# Patient Record
Sex: Female | Born: 1980
Health system: Southern US, Community
[De-identification: ages and names within clinical notes are randomized; demographics above are authoritative.]

## PROBLEM LIST (undated history)

## (undated) DIAGNOSIS — R519 Headache, unspecified: Secondary | ICD-10-CM

## (undated) DIAGNOSIS — Z8619 Personal history of other infectious and parasitic diseases: Secondary | ICD-10-CM

## (undated) HISTORY — DX: Personal history of other infectious and parasitic diseases: Z86.19

## (undated) HISTORY — DX: Headache, unspecified: R51.9

---

## 2018-09-25 ENCOUNTER — Ambulatory Visit (INDEPENDENT_AMBULATORY_CARE_PROVIDER_SITE_OTHER): Payer: 59 | Admitting: Physician Assistant

## 2018-09-25 ENCOUNTER — Other Ambulatory Visit: Payer: Self-pay

## 2018-09-25 ENCOUNTER — Encounter: Payer: Self-pay | Admitting: Physician Assistant

## 2018-09-25 VITALS — BP 130/82 | HR 71 | Temp 98.4°F | Ht 63.5 in | Wt 157.0 lb

## 2018-09-25 DIAGNOSIS — M25512 Pain in left shoulder: Secondary | ICD-10-CM | POA: Diagnosis not present

## 2018-09-25 NOTE — Patient Instructions (Signed)
It was great to meet you!  Please see Dr. Junius Roads on Monday for your shoulder.  Let's follow-up at your convenience for a physical, sooner if you have concerns.  Enjoy the beach!!  Inda Coke PA-C

## 2018-09-25 NOTE — Progress Notes (Signed)
Abigail Silva is a 38 y.o. female here to Establish Care.  I acted as a Education administrator for [employer], PA-C Anselmo Pickler, LPN  History of Present Illness:   Chief Complaint  Patient presents with  . Establish Care  . Shoulder Pain   Patient is new to establish care with me today. She and her family recently moved from [place]. Her husband is an Development worker, international aid for radiology with [employer]. 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 They have two children, aged 20 and 57.  Shoulder pain Pt c/o left shoulder pain x 4 months. She was doing push-ups about 3-4 months ago and had a moment where she had sudden sharp pain in her left shoulder. She didn't seek medical care for this. She used heating pad initially and modified her exercises. With time, her symptoms very gradually improved. A few weeks ago, her pain returned and is as severe as it was initially. She has difficulty lifting her kids, carrying groceries, sleeping, putting on clothes. Does get intermittent numbness/tingling in bilateral hands which has been chronic since her last pregnancy -- has never had this worked up. Denies prior neck injury or significant pain.  Health Maintenance: Immunizations -- UTD, declines Flu Colonoscopy -- N/A Mammogram -- N/A PAP -- overdue, pt will schedule with GYN Bone Density -- N/A Weight -- Weight: 157 lb (71.2 kg)   Depression screen [facility] 2/9 09/25/2018  Decreased Interest 0  Down, Depressed, Hopeless 0  PHQ - 2 Score 0    No flowsheet data found.   Other providers/specialists: Patient Care Team: Inda Coke, [place] as PCP - General (Physician Assistant)   Past Medical History:  Diagnosis Date  . Frequent headaches   . History of chicken pox      Social History   Socioeconomic History  . Marital status: Married    Spouse name: Not on file  . Number of children: Not on file  . Years of education: Not on file  . Highest  education level: Not on file  Occupational History  . Not on file  Social Needs  . Financial resource strain: Not on file  . Food insecurity    Worry: Not on file    Inability: Not on file  . Transportation needs    Medical: Not on file    Non-medical: Not on file  Tobacco Use  . Smoking status: Never Smoker  . Smokeless tobacco: Never Used  Substance and Sexual Activity  . Alcohol use: Yes    Alcohol/week: 2.0 standard drinks    Types: 2 Glasses of wine per week  . Drug use: Never  . Sexual activity: Yes    Birth control/protection: None  Lifestyle  . Physical activity    Days per week: Not on file    Minutes per session: Not on file  . Stress: Not on file  Relationships  . Social Herbalist on phone: Not on file    Gets together: Not on file    Attends religious service: Not on file    Active member of club or organization: Not on file    Attends meetings of clubs or organizations: Not on file    Relationship status: Not on file  . Intimate partner violence    Fear of current or ex partner: Not on file    Emotionally abused: Not on file    Physically abused: Not on file  Forced sexual activity: Not on file  Other Topics Concern  . Not on file  Social History Narrative  . Not on file    Past Surgical History:  Procedure Laterality Date  . CESAREAN SECTION  2011, 2015    History reviewed. No pertinent family history.  No Known Allergies   Current Medications:   Current Outpatient Medications:  .  ibuprofen (ADVIL) 200 MG tablet, Take 400 mg by mouth as needed., Disp: , Rfl:  .  Multiple Vitamin (MULTIVITAMIN) tablet, Take 1 tablet by mouth daily., Disp: , Rfl:    Review of Systems:   ROS Negative unless otherwise specified per HPI.  Vitals:   Vitals:   09/25/18 0825  BP: 130/82  Pulse: 71  Temp: 98.4 F (36.9 C)  TempSrc: Temporal  SpO2: 99%  Weight: 157 lb (71.2 kg)  Height: 5' 3.5" (1.613 m)      Body mass index is 27.38  kg/m.  Physical Exam:   Physical Exam Vitals signs and nursing note reviewed.  Constitutional:      General: She is not in acute distress.    Appearance: She is well-developed. She is not ill-appearing or toxic-appearing.  HENT:     Head: Normocephalic and atraumatic.  Cardiovascular:     Rate and Rhythm: Normal rate and regular rhythm.     Heart sounds: Normal heart sounds.  Pulmonary:     Effort: Pulmonary effort is normal. No accessory muscle usage or respiratory distress.     Breath sounds: Normal breath sounds.  Musculoskeletal:     Comments: LEFT Shoulder exam: No deformity of shoulders on inspection. No pain with palpation of shoulder landmarks.  Decreased ROM with abduction beyond 90 degrees, decreased ROM with flexion beyond 90 degrees.  Skin:    General: Skin is warm and dry.  Neurological:     Mental Status: She is alert.  Psychiatric:        Speech: Speech normal.        Behavior: Behavior is cooperative.     No results found for this or any previous visit.  Assessment and Plan:   [name] was seen today for establish care and shoulder pain.  Diagnoses and all orders for this visit:  Acute pain of left shoulder -     Ambulatory referral to Orthopedics   Will defer imaging, further work-up and treatment to Dr. Prince Rome, given her initial injury and resurgence of symptoms.  . Reviewed expectations re: course of current medical issues. . Discussed self-management of symptoms. . Outlined signs and symptoms indicating need for more acute intervention. . Patient verbalized understanding and all questions were answered. . See orders for this visit as documented in the electronic medical record. . Patient received an After-Visit Summary.  CMA or LPN served as scribe during this visit. History, Physical, and Plan performed by medical provider. The above documentation has been reviewed and is accurate and complete.  Jarold Motto, PA-C

## 2018-09-30 ENCOUNTER — Ambulatory Visit: Payer: 59 | Admitting: Family Medicine

## 2018-10-02 ENCOUNTER — Ambulatory Visit (INDEPENDENT_AMBULATORY_CARE_PROVIDER_SITE_OTHER): Payer: 59 | Admitting: Family Medicine

## 2018-10-02 ENCOUNTER — Encounter: Payer: Self-pay | Admitting: Family Medicine

## 2018-10-02 DIAGNOSIS — G8929 Other chronic pain: Secondary | ICD-10-CM

## 2018-10-02 DIAGNOSIS — M25512 Pain in left shoulder: Secondary | ICD-10-CM

## 2018-10-02 MED ORDER — IBUPROFEN 800 MG PO TABS: 800.0000 mg | ORAL_TABLET | Freq: Three times a day (TID) | ORAL | 1 refills | Status: DC | PRN

## 2018-10-02 MED FILL — IBUPROFEN 800 MG TABS: 800 | 30 days supply | Qty: 90 | Fill #0

## 2018-10-02 NOTE — Progress Notes (Signed)
Office Visit Note   Patient: Abigail Silva           Date of Birth: 08/21/1980           MRN: [id number] Visit Date: 10/02/2018 Requested by: Inda Coke, [address] [place],  [place] [address] PCP: Inda Coke, [place]  Subjective: Chief Complaint  Patient presents with  . Left Shoulder - Pain    Started having soreness in the shoulder in March. Tried to exercise through the pain, but stopped it. Then tried exercise again after it "seemed better." Started having shooting pains through the shoulder. Decreased ROM.    HPI: She is here with left shoulder pain.  Symptoms started in March.  She was being very active at that time, doing a lot of exercising including push-ups.  At some point she started noticing pain in the anterior lateral shoulder with certain movements.  She took it easy for a while and then seemed to be getting better, but as soon as she resumed exercises her pain came back.  She takes ibuprofen only when the pain is severe.  She is never had problems with her shoulder before.  She is otherwise been in good health.              ROS: No fevers or chills.  All other systems were reviewed and are negative.  Objective: Vital Signs: LMP 09/10/2018   Physical Exam:  General:  Alert and oriented, in no acute distress. Pulm:  Breathing unlabored. Psy:  Normal mood, congruent affect. Skin: No rash or erythema. Left shoulder: Very early adhesive capsulitis with abduction of 85 degrees compared to 110 degrees on the right.  Internal and external rotation range of motion still symmetric.  She is slightly tender near the long head biceps tendon, no subluxation detectable.  She is moderately tender in the lateral subacromial space and that seems to reproduce the pain.  Isometric rotator cuff strength is 5/5.  Speeds test is negative, O'Brien's test is negative.  Imaging: Diagnostic ultrasound left shoulder: Long head biceps tendon is located in the superior there is  slight swelling proximally and some fluid in the sheath without hyperemia on power Doppler imaging.  Subscapularis tendon has normal echotexture.  Supraspinatus likewise looks normal.  There is some fluid in the subacromial/subdeltoid bursa without hyperemia on power Doppler imaging.  Infraspinatus looks intact.  Assessment & Plan: 1.  Left shoulder impingement with subacromial/subdeltoid bursitis and early adhesive capsulitis. -We will try ibuprofen more regularly for the next little weeks.  I gave her some home exercises to do.  If symptoms are improving, then we could try formal physical therapy or else injection into the subacromial/subdeltoid bursa.  I will see her back as needed.     Procedures: No procedures performed  No notes on file     PMFS History: There are no active problems to display for this patient.  Past Medical History:  Diagnosis Date  . Frequent headaches   . History of chicken pox     Family History  Problem Relation Age of Onset  . Uterine cancer Mother   . Breast cancer Maternal Grandmother   . Colon cancer Neg Hx     Past Surgical History:  Procedure Laterality Date  . CESAREAN SECTION  2011, 2015   Social History   Occupational History  . Not on file  Tobacco Use  . Smoking status: Never Smoker  . Smokeless tobacco: Never Used  Substance and Sexual  Activity  . Alcohol use: Yes    Alcohol/week: 2.0 standard drinks    Types: 2 Glasses of wine per week  . Drug use: Never  . Sexual activity: Yes    Birth control/protection: None

## 2018-10-04 ENCOUNTER — Other Ambulatory Visit: Payer: Self-pay

## 2018-10-04 ENCOUNTER — Ambulatory Visit (INDEPENDENT_AMBULATORY_CARE_PROVIDER_SITE_OTHER): Payer: 59 | Admitting: Physician Assistant

## 2018-10-04 ENCOUNTER — Encounter: Payer: Self-pay | Admitting: Physician Assistant

## 2018-10-04 VITALS — BP 116/80 | HR 77 | Temp 98.3°F | Ht 63.0 in | Wt 157.0 lb

## 2018-10-04 DIAGNOSIS — R202 Paresthesia of skin: Secondary | ICD-10-CM

## 2018-10-04 DIAGNOSIS — Z0001 Encounter for general adult medical examination with abnormal findings: Secondary | ICD-10-CM

## 2018-10-04 DIAGNOSIS — Z0000 Encounter for general adult medical examination without abnormal findings: Secondary | ICD-10-CM | POA: Diagnosis not present

## 2018-10-04 DIAGNOSIS — Z136 Encounter for screening for cardiovascular disorders: Secondary | ICD-10-CM

## 2018-10-04 DIAGNOSIS — R200 Anesthesia of skin: Secondary | ICD-10-CM

## 2018-10-04 DIAGNOSIS — Z114 Encounter for screening for human immunodeficiency virus [HIV]: Secondary | ICD-10-CM

## 2018-10-04 DIAGNOSIS — Z13220 Encounter for screening for lipoid disorders: Secondary | ICD-10-CM | POA: Diagnosis not present

## 2018-10-04 LAB — LIPID PANEL
Cholesterol: 179 mg/dL (ref 0–200)
HDL: 46.2 mg/dL (ref >39.00)
LDL Cholesterol: 119 mg/dL — ABNORMAL HIGH (ref 0–99)
NonHDL: 133.05
Total CHOL/HDL Ratio: 4
Triglycerides: 70 mg/dL (ref 0.0–149.0)
VLDL: 14 mg/dL (ref 0.0–40.0)

## 2018-10-04 LAB — CBC WITH DIFFERENTIAL/PLATELET
Basophils Absolute: 0 10*3/uL (ref 0.0–0.1)
Basophils Relative: 0.5 % (ref 0.0–3.0)
Eosinophils Absolute: 0.2 10*3/uL (ref 0.0–0.7)
Eosinophils Relative: 2.2 % (ref 0.0–5.0)
HCT: 39.6 % (ref 36.0–46.0)
Hemoglobin: 12.8 g/dL (ref 12.0–15.0)
Lymphocytes Relative: 35.8 % (ref 12.0–46.0)
Lymphs Abs: 2.7 10*3/uL (ref 0.7–4.0)
MCHC: 32.4 g/dL (ref 30.0–36.0)
MCV: 87.3 fl (ref 78.0–100.0)
Monocytes Absolute: 0.5 10*3/uL (ref 0.1–1.0)
Monocytes Relative: 6.3 % (ref 3.0–12.0)
Neutro Abs: 4.1 10*3/uL (ref 1.4–7.7)
Neutrophils Relative %: 55.2 % (ref 43.0–77.0)
Platelets: 243 10*3/uL (ref 150.0–400.0)
RBC: 4.53 Mil/uL (ref 3.87–5.11)
RDW: 13.1 % (ref 11.5–15.5)
WBC: 7.5 10*3/uL (ref 4.0–10.5)

## 2018-10-04 LAB — COMPREHENSIVE METABOLIC PANEL
ALT: 11 U/L (ref 0–35)
AST: 16 U/L (ref 0–37)
Albumin: 4.6 g/dL (ref 3.5–5.2)
Alkaline Phosphatase: 41 U/L (ref 39–117)
BUN: 11 mg/dL (ref 6–23)
CO2: 26 mEq/L (ref 19–32)
Calcium: 9.6 mg/dL (ref 8.4–10.5)
Chloride: 106 mEq/L (ref 96–112)
Creatinine, Ser: 0.76 mg/dL (ref 0.40–1.20)
GFR: 85.09 mL/min (ref >60.00)
Glucose, Bld: 84 mg/dL (ref 70–99)
Potassium: 4.6 mEq/L (ref 3.5–5.1)
Sodium: 140 mEq/L (ref 135–145)
Total Bilirubin: 0.9 mg/dL (ref 0.2–1.2)
Total Protein: 7.1 g/dL (ref 6.0–8.3)

## 2018-10-04 LAB — VITAMIN B12: Vitamin B-12: 235 pg/mL (ref 211–911)

## 2018-10-04 NOTE — Patient Instructions (Signed)
It was great to see you!  Please go to the lab for blood work.   Our office will call you with your results unless you have chosen to receive results via MyChart.  If your blood work is normal we will follow-up each year for physicals and as scheduled for chronic medical problems.  If anything is abnormal we will treat accordingly and get you in for a follow-up.  Take care,  Aldona Bar   **While you are taking ibuprofen regularly, I recommend taking an acid reducer such as prilosec, nexium, pepcid -- this will protect your stomach while on this medication.  **Consider Flonase nasal spray for when you have issues with ears feeling clogged   Health Maintenance, Female Adopting a healthy lifestyle and getting preventive care are important in promoting health and wellness. Ask your health care provider about:  The right schedule for you to have regular tests and exams.  Things you can do on your own to prevent diseases and keep yourself healthy. What should I know about diet, weight, and exercise? Eat a healthy diet   Eat a diet that includes plenty of vegetables, fruits, low-fat dairy products, and lean protein.  Do not eat a lot of foods that are high in solid fats, added sugars, or sodium. Maintain a healthy weight Body mass index (BMI) is used to identify weight problems. It estimates body fat based on height and weight. Your health care provider can help determine your BMI and help you achieve or maintain a healthy weight. Get regular exercise Get regular exercise. This is one of the most important things you can do for your health. Most adults should:  Exercise for at least 150 minutes each week. The exercise should increase your heart rate and make you sweat (moderate-intensity exercise).  Do strengthening exercises at least twice a week. This is in addition to the moderate-intensity exercise.  Spend less time sitting. Even light physical activity can be beneficial. Watch  cholesterol and blood lipids Have your blood tested for lipids and cholesterol at 38 years of age, then have this test every 5 years. Have your cholesterol levels checked more often if:  Your lipid or cholesterol levels are high.  You are older than 38 years of age.  You are at high risk for heart disease. What should I know about cancer screening? Depending on your health history and family history, you may need to have cancer screening at various ages. This may include screening for:  Breast cancer.  Cervical cancer.  Colorectal cancer.  Skin cancer.  Lung cancer. What should I know about heart disease, diabetes, and high blood pressure? Blood pressure and heart disease  High blood pressure causes heart disease and increases the risk of stroke. This is more likely to develop in people who have high blood pressure readings, are of African descent, or are overweight.  Have your blood pressure checked: ? Every 3-5 years if you are 64-72 years of age. ? Every year if you are 37 years old or older. Diabetes Have regular diabetes screenings. This checks your fasting blood sugar level. Have the screening done:  Once every three years after age 70 if you are at a normal weight and have a low risk for diabetes.  More often and at a younger age if you are overweight or have a high risk for diabetes. What should I know about preventing infection? Hepatitis B If you have a higher risk for hepatitis B, you should be screened for this  virus. Talk with your health care provider to find out if you are at risk for hepatitis B infection. Hepatitis C Testing is recommended for:  Everyone born from [birth year] through 1965.  Anyone with known risk factors for hepatitis C. Sexually transmitted infections (STIs)  Get screened for STIs, including gonorrhea and chlamydia, if: ? You are sexually active and are younger than 38 years of age. ? You are older than 38 years of age and your health care  provider tells you that you are at risk for this type of infection. ? Your sexual activity has changed since you were last screened, and you are at increased risk for chlamydia or gonorrhea. Ask your health care provider if you are at risk.  Ask your health care provider about whether you are at high risk for HIV. Your health care provider may recommend a prescription medicine to help prevent HIV infection. If you choose to take medicine to prevent HIV, you should first get tested for HIV. You should then be tested every 3 months for as long as you are taking the medicine. Pregnancy  If you are about to stop having your period (premenopausal) and you may become pregnant, seek counseling before you get pregnant.  Take 400 to 800 micrograms (mcg) of folic acid every day if you become pregnant.  Ask for birth control (contraception) if you want to prevent pregnancy. Osteoporosis and menopause Osteoporosis is a disease in which the bones lose minerals and strength with aging. This can result in bone fractures. If you are 43 years old or older, or if you are at risk for osteoporosis and fractures, ask your health care provider if you should:  Be screened for bone loss.  Take a calcium or vitamin D supplement to lower your risk of fractures.  Be given hormone replacement therapy (HRT) to treat symptoms of menopause. Follow these instructions at home: Lifestyle  Do not use any products that contain nicotine or tobacco, such as cigarettes, e-cigarettes, and chewing tobacco. If you need help quitting, ask your health care provider.  Do not use street drugs.  Do not share needles.  Ask your health care provider for help if you need support or information about quitting drugs. Alcohol use  Do not drink alcohol if: ? Your health care provider tells you not to drink. ? You are pregnant, may be pregnant, or are planning to become pregnant.  If you drink alcohol: ? Limit how much you use to 0-1  drink a day. ? Limit intake if you are breastfeeding.  Be aware of how much alcohol is in your drink. In the [place] one drink equals one 12 oz bottle of beer (355 mL), one 5 oz glass of wine (148 mL), or one 1 oz glass of hard liquor (44 mL). General instructions  Schedule regular health, dental, and eye exams.  Stay current with your vaccines.  Tell your health care provider if: ? You often feel depressed. ? You have ever been abused or do not feel safe at home. Summary  Adopting a healthy lifestyle and getting preventive care are important in promoting health and wellness.  Follow your health care provider's instructions about healthy diet, exercising, and getting tested or screened for diseases.  Follow your health care provider's instructions on monitoring your cholesterol and blood pressure. This information is not intended to replace advice given to you by your health care provider. Make sure you discuss any questions you have with your health care provider. Document  Released: 07/04/2010 Document Revised: 12/12/2017 Document Reviewed: 12/12/2017 Elsevier Patient Education  2020 [employer].

## 2018-10-04 NOTE — Progress Notes (Signed)
I acted as a Education administrator for [employer], PA-C [employer], LPN  Subjective:    Abigail Silva is a 38 y.o. female and is here for a comprehensive physical exam.  HPI  Health Maintenance Due  Topic Date Due   HIV Screening  08/04/1995   Acute Concerns: Numbness and tingling in hands --she gets intermittent numbness and tingling in her hands, has been going on for a few years.  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  She states that her symptoms essentially started after she gave birth to her most recent child.  Denies neck pain.  Has not tried anything for her symptoms.  States that most of her fingers will have issues, but does typically spare her thumbs.  Chronic Issues: None  Health Maintenance: Immunizations -- UTD, declines Flu Colonoscopy -- N/A Mammogram -- N/A PAP -- overdue, has referral will schedule appointment. Bone Density -- N/A Diet --does eat a lot of sweets, but otherwise eats all food groups, maybe not enough vegetables per her report Sleep habits --no issues Exercise --does a lot of cardio, when she does not have shoulder pain she usually does more weight training Current Weight -- Weight: 157 lb (71.2 kg)  Weight History: Wt Readings from Last 10 Encounters:  10/04/18 157 lb (71.2 kg)  09/25/18 157 lb (71.2 kg)   Mood --overall good Patient's last menstrual period was 09/10/2018.   Depression screen PHQ 2/9 10/04/2018  Decreased Interest 0  Down, Depressed, Hopeless 0  PHQ - 2 Score 0    Other providers/specialists: Patient Care Team: Inda Coke, [place] as PCP - General (Physician Assistant)   PMHx, SurgHx, SocialHx, Medications, and Allergies were reviewed in the Visit Navigator and updated as appropriate.   Past Medical History:  Diagnosis Date   Frequent headaches    History of chicken pox      Past Surgical History:  Procedure Laterality Date   CESAREAN SECTION  2011, 2015     Family History  Problem  Relation Age of Onset   Uterine cancer Mother    Breast cancer Maternal Grandmother    Colon cancer Neg Hx     Social History   Tobacco Use   Smoking status: Never Smoker   Smokeless tobacco: Never Used  Substance Use Topics   Alcohol use: Yes    Alcohol/week: 2.0 standard drinks    Types: 2 Glasses of wine per week   Drug use: Never    Review of Systems:   Review of Systems  Constitutional: Negative for chills, fever, malaise/fatigue and weight loss.  HENT: Negative for hearing loss, sinus pain and sore throat.   Eyes: Negative for blurred vision.  Respiratory: Negative for cough and shortness of breath.   Cardiovascular: Negative for chest pain, palpitations and leg swelling.  Gastrointestinal: Negative for abdominal pain, constipation, diarrhea, heartburn, nausea and vomiting.  Genitourinary: Negative for dysuria, frequency and urgency.  Musculoskeletal: Negative for back pain, myalgias and neck pain.  Skin: Negative for itching and rash.  Neurological: Negative for dizziness, tingling, seizures, loss of consciousness and headaches.  Endo/Heme/Allergies: Negative for polydipsia.  Psychiatric/Behavioral: Negative for depression. The patient is not nervous/anxious.   All other systems reviewed and are negative.   Objective:   BP 116/80 (BP Location: Left Arm, Patient Position: Sitting, Cuff Size: Normal)    Pulse 77    Temp 98.3 F (36.8 C) (Temporal)    Ht 5\' 3"  (1.6 m)    Wt  157 lb (71.2 kg)    LMP 09/10/2018    SpO2 98%    BMI 27.81 kg/m   General Appearance:    Alert, cooperative, no distress, appears stated age  Head:    Normocephalic, without obvious abnormality, atraumatic  Eyes:    PERRL, conjunctiva/corneas clear, EOM's intact, fundi    benign, both eyes  Ears:    Normal TM's and external ear canals, both ears  Nose:   Nares normal, septum midline, mucosa normal, no drainage    or sinus tenderness  Throat:   Lips, mucosa, and tongue normal; teeth and  gums normal  Neck:   Supple, symmetrical, trachea midline, no adenopathy;    thyroid:  no enlargement/tenderness/nodules; no carotid   bruit or JVD  Back:     Symmetric, no curvature, ROM normal, no CVA tenderness  Lungs:     Clear to auscultation bilaterally, respirations unlabored  Chest Wall:    No tenderness or deformity   Heart:    Regular rate and rhythm, S1 and S2 normal, no murmur, rub   or gallop  Breast Exam:    Deferred  Abdomen:     Soft, non-tender, bowel sounds active all four quadrants,    no masses, no organomegaly  Genitalia:    Deferred  Rectal:    Deferred  Extremities:   Extremities normal, atraumatic, no cyanosis or edema  Pulses:   2+ and symmetric all extremities  Skin:   Skin color, texture, turgor normal, no rashes or lesions  Lymph nodes:   Cervical, supraclavicular, and axillary nodes normal  Neurologic:   CNII-XII intact, normal strength, sensation and reflexes    throughout   No results found for this or any previous visit.  Assessment/Plan:   [name] was seen today for annual exam.  Diagnoses and all orders for this visit:  Encounter for general adult medical examination with abnormal findings Today patient counseled on age appropriate routine health concerns for screening and prevention, each reviewed and up to date or declined. Immunizations reviewed and up to date or declined. Labs ordered and reviewed. Risk factors for depression reviewed and negative. Hearing function and visual acuity are intact. ADLs screened and addressed as needed. Functional ability and level of safety reviewed and appropriate. Education, counseling and referrals performed based on assessed risks today. Patient provided with a copy of personalized plan for preventive services.  Encounter for lipid screening for cardiovascular disease -     Lipid panel  Numbness and tingling in both hands Unclear etiology. Possibly due to overuse of vibrating tools. Will check B12. Further  work-up if symptoms worsen/persist. She declines any further work-up, other than B12, at this time. -     CBC with Differential/Platelet -     Comprehensive metabolic panel -     Vitamin B12  Screening for HIV (human immunodeficiency virus) -     HIV Antibody (routine testing w rflx)  Overweight Counseled. Discussed need to reduce sweets/candy. Continue exercise efforts.  Well Adult Exam: Labs ordered: Yes. Patient counseling was done. See below for items discussed. Discussed the patient's BMI. The BMI is not in the acceptable range; BMI management plan is completed Follow up as needed for acute illness.  Patient Counseling:   [x]     Nutrition: Stressed importance of moderation in sodium/caffeine intake, saturated fat and cholesterol, caloric balance, sufficient intake of fresh fruits, vegetables, fiber, calcium, iron, and 1 mg of folate supplement per day (for females capable of pregnancy).   [x]   Stressed the importance of regular exercise.    [x]     Substance Abuse: Discussed cessation/primary prevention of tobacco, alcohol, or other drug use; driving or other dangerous activities under the influence; availability of treatment for abuse.    [x]      Injury prevention: Discussed safety belts, safety helmets, smoke detector, smoking near bedding or upholstery.    [x]      Sexuality: Discussed sexually transmitted diseases, partner selection, use of condoms, avoidance of unintended pregnancy  and contraceptive alternatives.    [x]     Dental health: Discussed importance of regular tooth brushing, flossing, and dental visits.   [x]      Health maintenance and immunizations reviewed. Please refer to Health maintenance section.   CMA or LPN served as scribe during this visit. History, Physical, and Plan performed by medical provider. The above documentation has been reviewed and is accurate and complete.   , PA-C [facility]

## 2018-10-05 LAB — HIV ANTIBODY (ROUTINE TESTING W REFLEX): HIV 1&2 Ab, 4th Generation: NONREACTIVE

## 2018-10-07 ENCOUNTER — Encounter: Payer: Self-pay | Admitting: Physician Assistant

## 2018-11-06 ENCOUNTER — Encounter: Payer: Self-pay | Admitting: Family Medicine

## 2018-11-18 ENCOUNTER — Ambulatory Visit (INDEPENDENT_AMBULATORY_CARE_PROVIDER_SITE_OTHER): Payer: 59 | Admitting: Family Medicine

## 2018-11-18 ENCOUNTER — Other Ambulatory Visit: Payer: Self-pay

## 2018-11-18 ENCOUNTER — Encounter: Payer: Self-pay | Admitting: Family Medicine

## 2018-11-18 ENCOUNTER — Ambulatory Visit: Payer: Self-pay

## 2018-11-18 DIAGNOSIS — G8929 Other chronic pain: Secondary | ICD-10-CM | POA: Diagnosis not present

## 2018-11-18 DIAGNOSIS — M25512 Pain in left shoulder: Secondary | ICD-10-CM | POA: Diagnosis not present

## 2018-11-18 NOTE — Progress Notes (Signed)
   Office Visit Note   Patient: Abigail Silva           Date of Birth: Jun 16, 1980           MRN: [id number] Visit Date: 11/18/2018 Requested by: Inda Coke, [place],  [place] [address] PCP: Inda Coke, [place]  Subjective: Chief Complaint  Patient presents with  . Left Shoulder - Pain    Shoulder is better than at last visit, but still achy and inhibiting certain activities.    HPI: She is here with persistent left shoulder pain.  Symptoms are definitely better than when I saw saw her the first time, but she is still not able to reach all the way overhead, and when she sleeps on her right side and her left arm shifts forward, she has pain.  It is still inhibiting activities.  Ibuprofen helps some.              ROS:   All other systems were reviewed and are negative.  Objective: Vital Signs: There were no vitals taken for this visit.  Physical Exam:  General:  Alert and oriented, in no acute distress. Pulm:  Breathing unlabored. Psy:  Normal mood, congruent affect.  Left shoulder: She has pain reaching overhead and behind the back.  Range of motion is full but it takes her a while to get there.  She has pain with external rotation and with empty can test, and a little bit of weakness with empty can test.   Imaging: none  Assessment & Plan: 1.  Left shoulder impingement -Subacromial injection today.  This was done under ultrasound guidance but did not bill for that procedure. -She will contact me if symptoms persist.     Procedures: Left shoulder subacromial bursa injection: After sterile prep with Betadine, injected 5 cc 1% lidocaine without epinephrine and 40 mg methylprednisolone using ultrasound to guide needle placement into the lateral subacromial bursa.    PMFS History: There are no active problems to display for this patient.  Past Medical History:  Diagnosis Date  . Frequent headaches   . History of chicken pox     Family History   Problem Relation Age of Onset  . Uterine cancer Mother   . Breast cancer Maternal Grandmother   . Colon cancer Neg Hx     Past Surgical History:  Procedure Laterality Date  . CESAREAN SECTION  2011, 2015   Social History   Occupational History  . Not on file  Tobacco Use  . Smoking status: Never Smoker  . Smokeless tobacco: Never Used  Substance and Sexual Activity  . Alcohol use: Yes    Alcohol/week: 2.0 standard drinks    Types: 2 Glasses of wine per week  . Drug use: Never  . Sexual activity: Yes    Birth control/protection: None

## 2019-01-02 MED FILL — IBUPROFEN 800 MG TABS: 800 | 30 days supply | Qty: 90 | Fill #1

## 2019-01-23 ENCOUNTER — Encounter: Payer: Self-pay | Admitting: Physician Assistant

## 2019-01-24 ENCOUNTER — Telehealth: Payer: 59 | Admitting: Internal Medicine

## 2019-01-24 ENCOUNTER — Other Ambulatory Visit: Payer: Self-pay

## 2019-01-24 ENCOUNTER — Ambulatory Visit (HOSPITAL_COMMUNITY)
Admission: EM | Admit: 2019-01-24 | Discharge: 2019-01-24 | Disposition: A | Discharge status: Home / Self Care | Payer: 59 | Attending: Family Medicine | Admitting: Family Medicine

## 2019-01-24 ENCOUNTER — Encounter (HOSPITAL_COMMUNITY): Payer: Self-pay

## 2019-01-24 DIAGNOSIS — Z202 Contact with and (suspected) exposure to infections with a predominantly sexual mode of transmission: Secondary | ICD-10-CM | POA: Diagnosis not present

## 2019-01-24 NOTE — ED Triage Notes (Signed)
Patient presents to Urgent Care with complaints of needing treatment for chlamydia since her husband was just called and told that he had it. Patient reports she is not having any sx of infection.

## 2019-01-24 NOTE — ED Provider Notes (Signed)
[facility]    CSN: [id number] Arrival date & time: 01/24/19  1528      History   Chief Complaint Chief Complaint  Patient presents with  . Appointment    1530  . Exposure to STD    HPI Abigail Silva is a 39 y.o. female.   HPI Patient is here to be tested for STDs.  Her husband was told at his last complete physical examination that he had chlamydia.  According to this patient her husband told her that 1 test was positive, 1 test was negative, and that his doctor said he did not have to take antibiotics.  He chose to take antibiotics anyway.  He told his wife that he could have gotten this from his colonoscopy.  I corrected this belief and told her that this is a sexually transmitted disease.  We discussed testing for chlamydia, gonorrhea, trichomonas and also blood work for HIV and RPR.  Patient declines blood work against my advice.  She is having no symptoms Past Medical History:  Diagnosis Date  . Frequent headaches   . History of chicken pox     There are no problems to display for this patient.   Past Surgical History:  Procedure Laterality Date  . CESAREAN SECTION  2011, 2015    OB History   No obstetric history on file.      Home Medications    Prior to Admission medications   Medication Sig Start Date End Date Taking? Authorizing Provider  ibuprofen (ADVIL) 800 MG tablet Take 1 tablet (800 mg total) by mouth every 8 (eight) hours as needed. 10/02/18   Hilts, Casimiro Needle, MD  Multiple Vitamin (MULTIVITAMIN) tablet Take 1 tablet by mouth daily.    [provider]    Family History Family History  Problem Relation Age of Onset  . Uterine cancer Mother   . Breast cancer Maternal Grandmother   . Colon cancer Neg Hx     Social History Social History   Tobacco Use  . Smoking status: Never Smoker  . Smokeless tobacco: Never Used  Substance Use Topics  . Alcohol use: Yes    Alcohol/week: 2.0 standard drinks    Types: 2 Glasses of  wine per week    Comment: occ  . Drug use: Never     Allergies   Patient has no known allergies.   Review of Systems Review of Systems  Constitutional: Negative for chills and fever.  Eyes: Negative for pain.  Genitourinary: Negative for dysuria, frequency, vaginal bleeding and vaginal discharge.     Physical Exam Triage Vital Signs ED Triage Vitals  Enc Vitals Group     BP 01/24/19 1541 116/67     Pulse Rate 01/24/19 1541 68     Resp 01/24/19 1541 17     Temp 01/24/19 1541 98.8 F (37.1 C)     Temp Source 01/24/19 1541 Oral     SpO2 01/24/19 1541 99 %     Weight --      Height --      Head Circumference --      Peak Flow --      Pain Score 01/24/19 1540 0     Pain Loc --      Pain Edu? --      Excl. in GC? --    No data found.  Updated Vital Signs BP 116/67 (BP Location: Left Arm)   Pulse 68   Temp 98.8 F (37.1 C) (Oral)  Resp 17   SpO2 99%     Physical Exam Constitutional:      General: She is not in acute distress.    Appearance: She is well-developed.  HENT:     Head: Normocephalic and atraumatic.     Mouth/Throat:     Comments: Mask in place Eyes:     Conjunctiva/sclera: Conjunctivae normal.     Pupils: Pupils are equal, round, and reactive to light.  Cardiovascular:     Rate and Rhythm: Normal rate.  Pulmonary:     Effort: Pulmonary effort is normal. No respiratory distress.  Genitourinary:    Comments: Exam deferred.  Self swab obtained Musculoskeletal:        General: Normal range of motion.     Cervical back: Normal range of motion.  Skin:    General: Skin is warm and dry.  Neurological:     Mental Status: She is alert.      UC Treatments / Results  Labs (all labs ordered are listed, but only abnormal results are displayed) Labs Reviewed  CERVICOVAGINAL ANCILLARY ONLY    EKG   Radiology No results found.  Procedures Procedures (including critical care time)  Medications Ordered in UC Medications - No data to  display  Initial Impression / Assessment and Plan / UC Course  I have reviewed the triage vital signs and the nursing notes.  Pertinent labs & imaging results that were available during my care of the patient were reviewed by me and considered in my medical decision making (see chart for details).      Final Clinical Impressions(s) / UC Diagnoses   Final diagnoses:  Possible exposure to STD     Discharge Instructions     You can check for the results on My Chart We will call you if any tests are positive    ED Prescriptions    None     PDMP not reviewed this encounter.   Raylene Everts, MD 01/24/19 [phone]

## 2019-01-24 NOTE — Discharge Instructions (Addendum)
You can check for the results on My Chart We will call you if any tests are positive

## 2019-01-27 LAB — CERVICOVAGINAL ANCILLARY ONLY
Chlamydia: POSITIVE — AB
Neisseria Gonorrhea: NEGATIVE
Trichomonas: NEGATIVE

## 2019-01-28 ENCOUNTER — Telehealth (HOSPITAL_COMMUNITY): Payer: Self-pay | Admitting: Emergency Medicine

## 2019-01-28 MED ORDER — AZITHROMYCIN 250 MG PO TABS: 1000.0000 mg | ORAL_TABLET | Freq: Once | ORAL | 0 refills | Status: AC

## 2019-01-28 MED FILL — AZITHROMYCIN 250 MG TABLET: 250 | 1 days supply | Qty: 4 | Fill #0

## 2019-01-28 NOTE — Telephone Encounter (Signed)
Chlamydia is positive.  Rx po zithromax 1g #1 dose no refills was sent to the pharmacy of record.  Please refrain from sexual intercourse for 7 days to give the medicine time to work, sexual partners need to be notified and tested/treated.  Condoms may reduce risk of reinfection.  Recheck or followup with PCP for further evaluation if symptoms are not improving.   [facility] notified.  Patient contacted by phone and made aware of    results. Pt verbalized understanding and had all questions answered.    

## 2019-06-19 ENCOUNTER — Other Ambulatory Visit: Payer: Self-pay | Admitting: Family Medicine

## 2019-06-19 MED FILL — IBUPROFEN 800 MG TABS: 800 | 30 days supply | Qty: 90 | Fill #0

## 2019-07-23 ENCOUNTER — Encounter: Payer: Self-pay | Admitting: Family Medicine

## 2019-07-23 DIAGNOSIS — G8929 Other chronic pain: Secondary | ICD-10-CM

## 2019-07-23 DIAGNOSIS — M25512 Pain in left shoulder: Secondary | ICD-10-CM

## 2019-08-18 ENCOUNTER — Other Ambulatory Visit: Payer: 59

## 2019-08-21 ENCOUNTER — Ambulatory Visit
Admission: RE | Admit: 2019-08-21 | Discharge: 2019-08-21 | Disposition: A | Discharge status: Home / Self Care | Payer: 59 | Source: Ambulatory Visit | Attending: Family Medicine | Admitting: Family Medicine

## 2019-08-21 ENCOUNTER — Other Ambulatory Visit: Payer: Self-pay

## 2019-08-21 DIAGNOSIS — M25512 Pain in left shoulder: Secondary | ICD-10-CM | POA: Diagnosis not present

## 2019-08-21 DIAGNOSIS — G8929 Other chronic pain: Secondary | ICD-10-CM

## 2019-08-22 ENCOUNTER — Telehealth: Payer: Self-pay | Admitting: Family Medicine

## 2019-08-22 NOTE — Telephone Encounter (Signed)
MRI shows no rotator cuff tear.  The long head biceps tendon looks irritated (tendinosis), but not torn.  This could certainly explain the pain.    Options include:    - Try physical therapy for ultrasound treatments, iontophoresis, and other modalities.  - Do an injection into the biceps tendon sheath.  - Surgery if all else fails.

## 2019-08-22 NOTE — Telephone Encounter (Signed)
Called and scheduled

## 2019-08-26 ENCOUNTER — Ambulatory Visit: Payer: Self-pay

## 2019-08-26 ENCOUNTER — Other Ambulatory Visit: Payer: Self-pay

## 2019-08-26 ENCOUNTER — Encounter: Payer: Self-pay | Admitting: Family Medicine

## 2019-08-26 ENCOUNTER — Ambulatory Visit (INDEPENDENT_AMBULATORY_CARE_PROVIDER_SITE_OTHER): Payer: 59 | Admitting: Family Medicine

## 2019-08-26 DIAGNOSIS — M25512 Pain in left shoulder: Secondary | ICD-10-CM | POA: Diagnosis not present

## 2019-08-26 DIAGNOSIS — G8929 Other chronic pain: Secondary | ICD-10-CM

## 2019-08-26 NOTE — Progress Notes (Signed)
   Office Visit Note   Patient: Abigail Silva           Date of Birth: [birth year]           MRN: [id number] Visit Date: 08/26/2019 Requested by: Jarold Motto, [place] [address] [place],  [place] [address] PCP: Jarold Motto, [place]  Subjective: Chief Complaint  Patient presents with  . Left Shoulder - Pain    Biceps tendon injection    HPI: She is here for follow-up chronic left shoulder pain.  Recent MRI scan showed intact rotator cuff with long head biceps tendinosis.  She continues to have pain especially when reaching laterally and behind her back.               ROS:   All other systems were reviewed and are negative.  Objective: Vital Signs: There were no vitals taken for this visit.  Physical Exam:  General:  Alert and oriented, in no acute distress. Pulm:  Breathing unlabored. Psy:  Normal mood, congruent affect.  Left shoulder: Full range of motion with no adhesive capsulitis.  She is slightly tender at the long head biceps tendon, and has pain with abduction and external rotation.  Imaging: US Guided Needle Placement - No Linked Charges  Result Date: 08/26/2019 Ultrasound-guided left shoulder biceps tendon injection: After sterile prep with Betadine, injected 3 cc 1% lidocaine without epinephrine and 40 mg methylprednisolone into the long head biceps tendon sheath proximally.  She had good relief during the immediate anesthetic phase.   Assessment & Plan: 1.  Chronic left shoulder pain due to long head biceps tendinopathy -Discussed with her and elected to inject the biceps tendon under ultrasound guidance.  If she fails to get relief, could contemplate surgical consult for biceps tenodesis.  Injection given as above.     Procedures: No procedures performed  No notes on file     PMFS History: There are no problems to display for this patient.  Past Medical History:  Diagnosis Date  . Frequent headaches   . History of chicken pox     Family  History  Problem Relation Age of Onset  . Uterine cancer Mother   . Breast cancer Maternal Grandmother   . Colon cancer Neg Hx     Past Surgical History:  Procedure Laterality Date  . CESAREAN SECTION  2011, 2015   Social History   Occupational History  . Not on file  Tobacco Use  . Smoking status: Never Smoker  . Smokeless tobacco: Never Used  Vaping Use  . Vaping Use: Never used  Substance and Sexual Activity  . Alcohol use: Yes    Alcohol/week: 2.0 standard drinks    Types: 2 Glasses of wine per week    Comment: occ  . Drug use: Never  . Sexual activity: Yes    Birth control/protection: None

## 2019-09-09 ENCOUNTER — Other Ambulatory Visit: Payer: Self-pay | Admitting: Critical Care Medicine

## 2019-09-09 ENCOUNTER — Other Ambulatory Visit: Payer: 59

## 2019-09-09 DIAGNOSIS — Z20822 Contact with and (suspected) exposure to covid-19: Secondary | ICD-10-CM | POA: Diagnosis not present

## 2019-09-10 LAB — NOVEL CORONAVIRUS, NAA: SARS-CoV-2, NAA: NOT DETECTED

## 2019-09-10 LAB — SARS-COV-2, NAA 2 DAY TAT

## 2019-09-16 ENCOUNTER — Other Ambulatory Visit: Payer: 59

## 2020-02-13 DIAGNOSIS — M25512 Pain in left shoulder: Secondary | ICD-10-CM | POA: Diagnosis not present

## 2020-03-04 ENCOUNTER — Encounter: Payer: Self-pay | Admitting: Physician Assistant

## 2020-03-05 ENCOUNTER — Encounter: Payer: Self-pay | Admitting: Physician Assistant

## 2020-03-05 ENCOUNTER — Ambulatory Visit: Payer: 59 | Admitting: Physician Assistant

## 2020-03-05 ENCOUNTER — Other Ambulatory Visit: Payer: Self-pay

## 2020-03-05 ENCOUNTER — Other Ambulatory Visit (HOSPITAL_COMMUNITY): Payer: Self-pay | Admitting: Physician Assistant

## 2020-03-05 ENCOUNTER — Other Ambulatory Visit (HOSPITAL_COMMUNITY)
Admission: RE | Admit: 2020-03-05 | Discharge: 2020-03-05 | Disposition: A | Discharge status: Home / Self Care | Payer: 59 | Source: Ambulatory Visit | Attending: Physician Assistant | Admitting: Physician Assistant

## 2020-03-05 VITALS — BP 100/70 | HR 78 | Temp 98.2°F | Ht 63.0 in | Wt 161.4 lb

## 2020-03-05 DIAGNOSIS — N760 Acute vaginitis: Secondary | ICD-10-CM | POA: Insufficient documentation

## 2020-03-05 MED ORDER — FLUCONAZOLE 150 MG PO TABS: 150.0000 mg | ORAL_TABLET | Freq: Once | ORAL | 1 refills | Status: AC

## 2020-03-05 MED FILL — FLUCONAZOLE 150 MG TABS: 150 | 1 days supply | Qty: 1 | Fill #0

## 2020-03-05 NOTE — Patient Instructions (Signed)
It was great to see you!  Start oral diflucan tablet x 1. May repeat in 1 week if symptoms persist.  I will be in touch with your swab results.  Take care,  Jarold Motto PA-C

## 2020-03-05 NOTE — Progress Notes (Signed)
Abigail Silva is a 40 y.o. female here for a new problem.  I acted as a Neurosurgeon for [employer], PA-C Abigail Mull, LPN   History of Present Illness:   Chief Complaint  Patient presents with  . Vaginal Discharge    HPI   Vaginal discharge Pt c/o white vaginal discharge with itching started yesterday. Pt said 3 weeks ago she thought she had a yeast infection and treated with OTC Monistat went away and is now back.  No recent antibiotics. No pelvic pain, pain with sex, odor, unusual vaginal bleeding.   Past Medical History:  Diagnosis Date  . Frequent headaches   . History of chicken pox      Social History   Tobacco Use  . Smoking status: Never Smoker  . Smokeless tobacco: Never Used  Vaping Use  . Vaping Use: Never used  Substance Use Topics  . Alcohol use: Yes    Alcohol/week: 2.0 standard drinks    Types: 2 Glasses of wine per week    Comment: occ  . Drug use: Never    Past Surgical History:  Procedure Laterality Date  . CESAREAN SECTION  2011, 2015    Family History  Problem Relation Age of Onset  . Uterine cancer Mother   . Breast cancer Maternal Grandmother   . Colon cancer Neg Hx     No Known Allergies  Current Medications:   Current Outpatient Medications:  .  fluconazole (DIFLUCAN) 150 MG tablet, Take 1 tablet (150 mg total) by mouth once for 1 dose., Disp: 1 tablet, Rfl: 1 .  ibuprofen (ADVIL) 800 MG tablet, TAKE 1 TABLET (800 MG TOTAL) BY MOUTH EVERY 8 HOURS AS NEEDED., Disp: 90 tablet, Rfl: 1 .  Multiple Vitamin (MULTIVITAMIN) tablet, Take 1 tablet by mouth daily., Disp: , Rfl:    Review of Systems:   ROS  Negative unless otherwise specified per HPI.  Vitals:   Vitals:   03/05/20 0811  BP: 100/70  Pulse: 78  Temp: 98.2 F (36.8 C)  TempSrc: Temporal  SpO2: 98%  Weight: 161 lb 6.1 oz (73.2 kg)  Height: 5\' 3"  (1.6 m)     Body mass index is 28.59 kg/m.  Physical Exam:   Physical Exam Vitals and nursing note  reviewed. Exam conducted with a chaperone present , LPN).  Constitutional:      General: She is not in acute distress.    Appearance: She is well-developed and well-nourished. She is not ill-appearing, toxic-appearing or sickly-appearing.  HENT:     Head: Normocephalic and atraumatic.  Eyes:     Extraocular Movements: EOM normal.     Conjunctiva/sclera: Conjunctivae normal.  Cardiovascular:     Rate and Rhythm: Normal rate and regular rhythm.     Pulses: Normal pulses.     Heart sounds: Normal heart sounds, S1 normal and S2 normal.     Comments: No LE edema Pulmonary:     Effort: Pulmonary effort is normal.     Breath sounds: Normal breath sounds.  Genitourinary:    Vagina: Vaginal discharge (white with clumps) present.  Musculoskeletal:        General: Normal range of motion.     Cervical back: Normal range of motion and neck supple.  Skin:    General: Skin is warm, dry and intact.  Neurological:     Mental Status: She is alert and oriented to person, place, and time.     GCS: GCS eye subscore is 4. GCS verbal  subscore is 5. GCS motor subscore is 6.  Psychiatric:        Mood and Affect: Mood and affect normal.        Speech: Speech normal.        Behavior: Behavior normal. Behavior is cooperative.        Thought Content: Thought content normal.        Judgment: Judgment normal.      Assessment and Plan:   Abigail Silva was seen today for vaginal discharge.  Diagnoses and all orders for this visit:  Acute vaginitis Cervical swab obtained. Symptoms consistent with likely yeast infection. Will treat with one time oral diflucan tablet, with option to repeat in one week if needed. Further treatment based on results. -     Cervicovaginal ancillary only  Other orders -     fluconazole (DIFLUCAN) 150 MG tablet; Take 1 tablet (150 mg total) by mouth once for 1 dose.   CMA or LPN served as scribe during this visit. History, Physical, and Plan performed by medical  provider. The above documentation has been reviewed and is accurate and complete.   Jarold Motto, PA-C

## 2020-03-08 ENCOUNTER — Other Ambulatory Visit: Payer: Self-pay | Admitting: Physician Assistant

## 2020-03-08 ENCOUNTER — Other Ambulatory Visit (HOSPITAL_COMMUNITY): Payer: Self-pay | Admitting: Physician Assistant

## 2020-03-08 LAB — CERVICOVAGINAL ANCILLARY ONLY
Bacterial Vaginitis (gardnerella): POSITIVE — AB
Candida Glabrata: NEGATIVE
Candida Vaginitis: POSITIVE — AB
Chlamydia: NEGATIVE
Comment: NEGATIVE
Comment: NEGATIVE
Comment: NEGATIVE
Comment: NEGATIVE
Comment: NEGATIVE
Comment: NORMAL
Neisseria Gonorrhea: NEGATIVE
Trichomonas: NEGATIVE

## 2020-03-08 MED ORDER — METRONIDAZOLE 500 MG PO TABS: 500.0000 mg | ORAL_TABLET | Freq: Two times a day (BID) | ORAL | 0 refills | Status: DC

## 2020-03-08 MED FILL — metroNIDAZOLE 500 MG TABS: 500 | 7 days supply | Qty: 14 | Fill #0

## 2020-04-15 ENCOUNTER — Encounter (HOSPITAL_BASED_OUTPATIENT_CLINIC_OR_DEPARTMENT_OTHER): Payer: Self-pay | Admitting: Orthopaedic Surgery

## 2020-04-15 ENCOUNTER — Other Ambulatory Visit: Payer: Self-pay

## 2020-04-19 ENCOUNTER — Other Ambulatory Visit (HOSPITAL_COMMUNITY)
Admission: RE | Admit: 2020-04-19 | Discharge: 2020-04-19 | Disposition: A | Discharge status: Home / Self Care | Payer: 59 | Source: Ambulatory Visit | Attending: Orthopaedic Surgery | Admitting: Orthopaedic Surgery

## 2020-04-19 DIAGNOSIS — Z20822 Contact with and (suspected) exposure to covid-19: Secondary | ICD-10-CM | POA: Insufficient documentation

## 2020-04-19 DIAGNOSIS — Z01812 Encounter for preprocedural laboratory examination: Secondary | ICD-10-CM | POA: Insufficient documentation

## 2020-04-20 LAB — SARS CORONAVIRUS 2 (TAT 6-24 HRS): SARS Coronavirus 2: NEGATIVE

## 2020-04-20 NOTE — H&P (Signed)
PREOPERATIVE H&P  Chief Complaint: LEFT SHOULDER OSTEOARTHRITIS, IMPINGEMENT SYNDROME, BICIPITAL TENDINITIS, ADHESIVE CAPSULITIS  HPI: Abigail Silva is a 40 y.o. female who is scheduled for, Procedure(s): SHOULDER ARTHROSCOPY WITH EXTENSIVE DEBRIDEMENT; SUBACROMIAL DECOMPRESSION; PARTIAL ACROMIOPLASTY WITH CORACOACROMIAL RELEASE AND BICEP TENODESIS SHOULDER ARTHROSCOPY WITH DISTAL CLAVICULECTOMY.   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 She has had pain for two years in the shoulders. This has been anterior shoulder pain primarily. She is not able to do push-ups. She has been limited going over her head.  She has been bothered at sleep. It is painful at night. She had injections with ultrasound guidance to the subacromial space as well as her biceps. She had improvement with these. She is limited and she has become more and more stiff over time as well, she is worried that is contributing.   Her symptoms are rated as moderate to severe, and have been worsening.  This is significantly impairing activities of daily living.    Please see clinic note for further details on this patient's care.    She has elected for surgical management.   Past Medical History:  Diagnosis Date  . Frequent headaches   . History of chicken pox    Past Surgical History:  Procedure Laterality Date  . CESAREAN SECTION  2011, 2015   Social History   Socioeconomic History  . Marital status: Married    Spouse name: Not on file  . Number of children: Not on file  . Years of education: Not on file  . Highest education level: Not on file  Occupational History  . Not on file  Tobacco Use  . Smoking status: Never Smoker  . Smokeless tobacco: Never Used  Vaping Use  . Vaping Use: Never used  Substance and Sexual Activity  . Alcohol use: Yes    Alcohol/week: 2.0 standard drinks    Types: 2 Glasses of wine per week    Comment:  occ  . Drug use: Never  . Sexual activity: Yes    Birth control/protection: None  Other Topics Concern  . Not on file  Social History Narrative   Recent move from [place]   Husband works for [employer], Interior and spatial designer of Radiology   Two children, ages 77 and 7 (as of 2020)   Armed forces operational officer   Social Determinants of Health   Financial Resource Strain: Not on file  Food Insecurity: Not on file  Transportation Needs: Not on file  Physical Activity: Not on file  Stress: Not on file  Social Connections: Not on file   Family History  Problem Relation Age of Onset  . Uterine cancer Mother   . Breast cancer Maternal Grandmother   . Colon cancer Neg Hx    No Known Allergies Prior to Admission medications   Medication Sig Start Date End Date Taking? Authorizing Provider  Multiple Vitamin (MULTIVITAMIN) tablet Take 1 tablet by mouth daily.   Yes [provider]  ibuprofen (ADVIL) 800 MG tablet TAKE 1 TABLET (800 MG TOTAL) BY MOUTH EVERY 8 HOURS AS NEEDED. 06/19/19   Hilts, Casimiro Needle, MD    ROS: All other systems have been reviewed and were otherwise negative with the exception of those mentioned in the HPI and as above.  Physical Exam: General: Alert, no acute distress Cardiovascular: No pedal edema Respiratory: No cyanosis, no use of accessory musculature GI: No organomegaly, abdomen is soft and non-tender Skin: No lesions in the area of  chief complaint Neurologic: Sensation intact distally Psychiatric: Patient is competent for consent with normal mood and affect Lymphatic: No axillary or cervical lymphadenopathy  MUSCULOSKELETAL:  Left shoulder: Active forward elevation to 140, passive 140, external rotation to 60 versus 90. Cuff strength is 5-/5 supraspinatus. Positive AC tenderness to palpation. Impingement on O'Brien's.   Imaging: MRI and X-rays reviewed. The X-rays were relatively normal. The MRI demonstrates adhesive capsulitis findings. There is fluid around the biceps. There is  tenderness over the intra- articular portions of the biceps.   Assessment: LEFT SHOULDER OSTEOARTHRITIS, IMPINGEMENT SYNDROME, BICIPITAL TENDINITIS, ADHESIVE CAPSULITIS  Plan: Plan for Procedure(s): SHOULDER ARTHROSCOPY WITH EXTENSIVE DEBRIDEMENT; SUBACROMIAL DECOMPRESSION; PARTIAL ACROMIOPLASTY WITH CORACOACROMIAL RELEASE AND BICEP TENODESIS SHOULDER ARTHROSCOPY WITH DISTAL CLAVICULECTOMY  The risks benefits and alternatives were discussed with the patient including but not limited to the risks of nonoperative treatment, versus surgical intervention including infection, bleeding, nerve injury,  blood clots, cardiopulmonary complications, morbidity, mortality, among others, and they were willing to proceed.   The patient acknowledged the explanation, agreed to proceed with the plan and consent was signed.   Operative Plan: Left shoulder scope with capsular release, distal clavicle resection, biceps tenodesis and subacromial decompression. Discharge Medications: Standard DVT Prophylaxis: None Physical Therapy: Outpatient PT - early therapy Special Discharge needs: Sling. IceMan   Vernetta Honey, PA-C  04/20/2020 4:33 PM

## 2020-04-21 NOTE — Anesthesia Preprocedure Evaluation (Addendum)
Anesthesia Evaluation  Patient identified by MRN, date of birth, ID band Patient awake    Reviewed: Allergy & Precautions, NPO status , Patient's Chart, lab work & pertinent test results  Airway Mallampati: I  TM Distance: >3 FB Neck ROM: Full    Dental no notable dental hx.    Pulmonary neg pulmonary ROS,    Pulmonary exam normal breath sounds clear to auscultation       Cardiovascular Exercise Tolerance: Good negative cardio ROS Normal cardiovascular exam Rhythm:Regular Rate:Normal     Neuro/Psych  Headaches, negative psych ROS   GI/Hepatic negative GI ROS, Neg liver ROS,   Endo/Other  negative endocrine ROS  Renal/GU   negative genitourinary   Musculoskeletal negative musculoskeletal ROS (+)   Abdominal   Peds negative pediatric ROS (+)  Hematology negative hematology ROS (+)   Anesthesia Other Findings   Reproductive/Obstetrics negative OB ROS                             Anesthesia Physical Anesthesia Plan  ASA: I  Anesthesia Plan: Regional and General   Post-op Pain Management: GA combined w/ Regional for post-op pain   Induction: Intravenous  PONV Risk Score and Plan: 3 and Ondansetron, Dexamethasone, Treatment may vary due to age or medical condition, Scopolamine patch - Pre-op and Midazolam  Airway Management Planned: Oral ETT  Additional Equipment: None  Intra-op Plan:   Post-operative Plan: Extubation in OR  Informed Consent: I have reviewed the patients History and Physical, chart, labs and discussed the procedure including the risks, benefits and alternatives for the proposed anesthesia with the patient or authorized representative who has indicated his/her understanding and acceptance.     Dental advisory given  Plan Discussed with: CRNA, Anesthesiologist and Surgeon  Anesthesia Plan Comments: (Interscalene block with Exparel for postop pain control.  GETA.)       Anesthesia Quick Evaluation

## 2020-04-22 ENCOUNTER — Encounter (HOSPITAL_BASED_OUTPATIENT_CLINIC_OR_DEPARTMENT_OTHER): Payer: Self-pay | Admitting: Orthopaedic Surgery

## 2020-04-22 ENCOUNTER — Encounter (HOSPITAL_BASED_OUTPATIENT_CLINIC_OR_DEPARTMENT_OTHER)
Admission: RE | Disposition: A | Discharge status: Home / Self Care | Payer: Self-pay | Source: Home / Self Care | Attending: Orthopaedic Surgery

## 2020-04-22 ENCOUNTER — Ambulatory Visit (HOSPITAL_BASED_OUTPATIENT_CLINIC_OR_DEPARTMENT_OTHER): Payer: 59 | Admitting: Anesthesiology

## 2020-04-22 ENCOUNTER — Ambulatory Visit (HOSPITAL_BASED_OUTPATIENT_CLINIC_OR_DEPARTMENT_OTHER)
Admission: RE | Admit: 2020-04-22 | Discharge: 2020-04-22 | Disposition: A | Discharge status: Home / Self Care | Payer: 59 | Attending: Orthopaedic Surgery | Admitting: Orthopaedic Surgery

## 2020-04-22 ENCOUNTER — Other Ambulatory Visit: Payer: Self-pay

## 2020-04-22 ENCOUNTER — Other Ambulatory Visit (HOSPITAL_COMMUNITY): Payer: Self-pay

## 2020-04-22 DIAGNOSIS — M19012 Primary osteoarthritis, left shoulder: Secondary | ICD-10-CM | POA: Diagnosis not present

## 2020-04-22 DIAGNOSIS — M7542 Impingement syndrome of left shoulder: Secondary | ICD-10-CM | POA: Diagnosis not present

## 2020-04-22 DIAGNOSIS — Z79899 Other long term (current) drug therapy: Secondary | ICD-10-CM | POA: Insufficient documentation

## 2020-04-22 DIAGNOSIS — M24112 Other articular cartilage disorders, left shoulder: Secondary | ICD-10-CM | POA: Diagnosis not present

## 2020-04-22 DIAGNOSIS — M7502 Adhesive capsulitis of left shoulder: Secondary | ICD-10-CM | POA: Insufficient documentation

## 2020-04-22 DIAGNOSIS — G8918 Other acute postprocedural pain: Secondary | ICD-10-CM | POA: Diagnosis not present

## 2020-04-22 DIAGNOSIS — M7522 Bicipital tendinitis, left shoulder: Secondary | ICD-10-CM | POA: Insufficient documentation

## 2020-04-22 HISTORY — PX: BICEPT TENODESIS: SHX5116

## 2020-04-22 HISTORY — PX: SHOULDER ARTHROSCOPY WITH ROTATOR CUFF REPAIR AND SUBACROMIAL DECOMPRESSION: SHX5686

## 2020-04-22 LAB — POCT PREGNANCY, URINE: Preg Test, Ur: NEGATIVE

## 2020-04-22 SURGERY — TENODESIS, BICEPS
Anesthesia: Regional | Site: Shoulder | Laterality: Left

## 2020-04-22 MED ORDER — OXYCODONE HCL 5 MG/5ML PO SOLN: 5.0000 mg | Freq: Once | ORAL | Status: DC | PRN

## 2020-04-22 MED ORDER — EPHEDRINE 5 MG/ML INJ
INTRAVENOUS | Status: AC
  Filled 2020-04-22: qty 10

## 2020-04-22 MED ORDER — PROPOFOL 10 MG/ML IV BOLUS
INTRAVENOUS | Status: AC
  Filled 2020-04-22: qty 40

## 2020-04-22 MED ORDER — ONDANSETRON HCL 4 MG PO TABS
4.0000 mg | ORAL_TABLET | Freq: Three times a day (TID) | ORAL | 0 refills | Status: AC | PRN
  Filled 2020-04-22: qty 10, 4d supply, fill #0

## 2020-04-22 MED ORDER — MIDAZOLAM HCL 2 MG/2ML IJ SOLN
1.0000 mg | Freq: Once | INTRAMUSCULAR | Status: AC
Start: 2020-04-22 — End: 2020-04-22
  Administered 2020-04-22: 2 mg via INTRAVENOUS

## 2020-04-22 MED ORDER — DROPERIDOL 2.5 MG/ML IJ SOLN: 0.6250 mg | Freq: Once | INTRAMUSCULAR | Status: DC | PRN

## 2020-04-22 MED ORDER — ROCURONIUM BROMIDE 10 MG/ML (PF) SYRINGE
PREFILLED_SYRINGE | INTRAVENOUS | Status: AC
  Filled 2020-04-22: qty 10

## 2020-04-22 MED ORDER — FENTANYL CITRATE (PF) 100 MCG/2ML IJ SOLN
INTRAMUSCULAR | Status: DC | PRN
  Administered 2020-04-22: 50 ug via INTRAVENOUS

## 2020-04-22 MED ORDER — ROCURONIUM BROMIDE 100 MG/10ML IV SOLN
INTRAVENOUS | Status: DC | PRN
  Administered 2020-04-22: 60 mg via INTRAVENOUS

## 2020-04-22 MED ORDER — SUGAMMADEX SODIUM 200 MG/2ML IV SOLN
INTRAVENOUS | Status: DC | PRN
  Administered 2020-04-22: 150 mg via INTRAVENOUS

## 2020-04-22 MED ORDER — DEXAMETHASONE SODIUM PHOSPHATE 4 MG/ML IJ SOLN
INTRAMUSCULAR | Status: DC | PRN
  Administered 2020-04-22: 8 mg via INTRAVENOUS

## 2020-04-22 MED ORDER — ONDANSETRON HCL 4 MG/2ML IJ SOLN
INTRAMUSCULAR | Status: AC
  Filled 2020-04-22: qty 2

## 2020-04-22 MED ORDER — FENTANYL CITRATE (PF) 100 MCG/2ML IJ SOLN
INTRAMUSCULAR | Status: AC
  Filled 2020-04-22: qty 2

## 2020-04-22 MED ORDER — ACETAMINOPHEN 500 MG PO TABS
1000.0000 mg | ORAL_TABLET | Freq: Three times a day (TID) | ORAL | 0 refills | Status: AC
  Filled 2020-04-22: qty 84, 14d supply, fill #0

## 2020-04-22 MED ORDER — EPINEPHRINE PF 1 MG/ML IJ SOLN
INTRAMUSCULAR | Status: AC
  Filled 2020-04-22: qty 8

## 2020-04-22 MED ORDER — ONDANSETRON HCL 4 MG/2ML IJ SOLN
INTRAMUSCULAR | Status: DC | PRN
  Administered 2020-04-22: 4 mg via INTRAVENOUS

## 2020-04-22 MED ORDER — BUPIVACAINE LIPOSOME 1.3 % IJ SUSP
INTRAMUSCULAR | Status: DC | PRN
  Administered 2020-04-22: 10 mL

## 2020-04-22 MED ORDER — CEFAZOLIN SODIUM-DEXTROSE 2-4 GM/100ML-% IV SOLN
INTRAVENOUS | Status: AC
  Filled 2020-04-22: qty 100

## 2020-04-22 MED ORDER — PROMETHAZINE HCL 25 MG/ML IJ SOLN: 6.2500 mg | INTRAMUSCULAR | Status: DC | PRN

## 2020-04-22 MED ORDER — CEFAZOLIN SODIUM-DEXTROSE 2-4 GM/100ML-% IV SOLN
2.0000 g | INTRAVENOUS | Status: AC
  Administered 2020-04-22: 2 g via INTRAVENOUS

## 2020-04-22 MED ORDER — CELECOXIB 100 MG PO CAPS
100.0000 mg | ORAL_CAPSULE | Freq: Two times a day (BID) | ORAL | 0 refills | Status: AC
  Filled 2020-04-22: qty 60, 30d supply, fill #0

## 2020-04-22 MED ORDER — FENTANYL CITRATE (PF) 100 MCG/2ML IJ SOLN
50.0000 ug | Freq: Once | INTRAMUSCULAR | Status: AC
  Administered 2020-04-22: 50 ug via INTRAVENOUS

## 2020-04-22 MED ORDER — LIDOCAINE 2% (20 MG/ML) 5 ML SYRINGE
INTRAMUSCULAR | Status: AC
  Filled 2020-04-22: qty 5

## 2020-04-22 MED ORDER — MIDAZOLAM HCL 2 MG/2ML IJ SOLN
INTRAMUSCULAR | Status: AC
  Filled 2020-04-22: qty 2

## 2020-04-22 MED ORDER — BUPIVACAINE HCL (PF) 0.5 % IJ SOLN
INTRAMUSCULAR | Status: DC | PRN
  Administered 2020-04-22: 20 mL

## 2020-04-22 MED ORDER — METHOCARBAMOL 500 MG PO TABS
500.0000 mg | ORAL_TABLET | Freq: Three times a day (TID) | ORAL | 0 refills | Status: DC | PRN
  Filled 2020-04-22: qty 20, 7d supply, fill #0

## 2020-04-22 MED ORDER — LIDOCAINE HCL (CARDIAC) PF 100 MG/5ML IV SOSY
PREFILLED_SYRINGE | INTRAVENOUS | Status: DC | PRN
  Administered 2020-04-22: 80 mg via INTRAVENOUS

## 2020-04-22 MED ORDER — ACETAMINOPHEN 500 MG PO TABS
1000.0000 mg | ORAL_TABLET | Freq: Once | ORAL | Status: AC
  Administered 2020-04-22: 1000 mg via ORAL

## 2020-04-22 MED ORDER — DEXAMETHASONE SODIUM PHOSPHATE 10 MG/ML IJ SOLN
INTRAMUSCULAR | Status: AC
  Filled 2020-04-22: qty 1

## 2020-04-22 MED ORDER — FENTANYL CITRATE (PF) 100 MCG/2ML IJ SOLN: 25.0000 ug | INTRAMUSCULAR | Status: DC | PRN

## 2020-04-22 MED ORDER — SCOPOLAMINE 1 MG/3DAYS TD PT72
MEDICATED_PATCH | TRANSDERMAL | Status: AC
  Filled 2020-04-22: qty 1

## 2020-04-22 MED ORDER — PROPOFOL 10 MG/ML IV BOLUS
INTRAVENOUS | Status: DC | PRN
  Administered 2020-04-22: 150 mg via INTRAVENOUS

## 2020-04-22 MED ORDER — OXYCODONE HCL 5 MG PO TABS
ORAL_TABLET | ORAL | 0 refills | Status: AC
  Filled 2020-04-22: qty 30, 5d supply, fill #0

## 2020-04-22 MED ORDER — ACETAMINOPHEN 500 MG PO TABS
ORAL_TABLET | ORAL | Status: AC
  Filled 2020-04-22: qty 2

## 2020-04-22 MED ORDER — PHENYLEPHRINE 40 MCG/ML (10ML) SYRINGE FOR IV PUSH (FOR BLOOD PRESSURE SUPPORT)
PREFILLED_SYRINGE | INTRAVENOUS | Status: AC
  Filled 2020-04-22: qty 10

## 2020-04-22 MED ORDER — LACTATED RINGERS IV SOLN: INTRAVENOUS | Status: DC

## 2020-04-22 MED ORDER — EPHEDRINE SULFATE 50 MG/ML IJ SOLN
INTRAMUSCULAR | Status: DC | PRN
  Administered 2020-04-22: 5 mg via INTRAVENOUS
  Administered 2020-04-22 (×3): 10 mg via INTRAVENOUS

## 2020-04-22 MED ORDER — OXYCODONE HCL 5 MG PO TABS
5.0000 mg | ORAL_TABLET | Freq: Once | ORAL | Status: DC | PRN
Start: 2020-04-22 — End: 2020-04-22

## 2020-04-22 MED ORDER — SCOPOLAMINE 1 MG/3DAYS TD PT72
1.0000 | MEDICATED_PATCH | TRANSDERMAL | Status: DC
  Administered 2020-04-22: 1.5 mg via TRANSDERMAL

## 2020-04-22 MED ORDER — PHENYLEPHRINE HCL (PRESSORS) 10 MG/ML IV SOLN
INTRAVENOUS | Status: DC | PRN
  Administered 2020-04-22 (×2): 80 ug via INTRAVENOUS

## 2020-04-22 MED ORDER — SODIUM CHLORIDE 0.9 % IR SOLN: Status: DC | PRN

## 2020-04-22 SURGICAL SUPPLY — 61 items
AID PSTN UNV HD RSTRNT DISP (MISCELLANEOUS) ×2
ANCHOR SUT 1.8 FBRTK KNTLS 2SU (Anchor) ×6 IMPLANT
APL PRP STRL LF DISP 70% ISPRP (MISCELLANEOUS) ×2
BLADE EXCALIBUR 4.0X13 (MISCELLANEOUS) ×3 IMPLANT
BURR OVAL 8 FLU 4.0X13 (MISCELLANEOUS) IMPLANT
CANNULA 5.75X71 LONG (CANNULA) IMPLANT
CANNULA PASSPORT 5 (CANNULA) IMPLANT
CANNULA PASSPORT BUTTON 10-40 (CANNULA) ×3 IMPLANT
CANNULA TWIST IN 8.25X7CM (CANNULA) IMPLANT
CHLORAPREP W/TINT 26 (MISCELLANEOUS) ×3 IMPLANT
CLSR STERI-STRIP ANTIMIC 1/2X4 (GAUZE/BANDAGES/DRESSINGS) ×3 IMPLANT
COOLER ICEMAN CLASSIC (MISCELLANEOUS) ×3 IMPLANT
COVER WAND RF STERILE (DRAPES) IMPLANT
DRAPE IMP U-DRAPE 54X76 (DRAPES) ×3 IMPLANT
DRAPE INCISE IOBAN 66X45 STRL (DRAPES) IMPLANT
DRAPE SHOULDER BEACH CHAIR (DRAPES) ×3 IMPLANT
DRSG PAD ABDOMINAL 8X10 ST (GAUZE/BANDAGES/DRESSINGS) ×6 IMPLANT
DW OUTFLOW CASSETTE/TUBE SET (MISCELLANEOUS) ×3 IMPLANT
GAUZE SPONGE 4X4 12PLY STRL (GAUZE/BANDAGES/DRESSINGS) ×3 IMPLANT
GLOVE SRG 8 PF TXTR STRL LF DI (GLOVE) ×2 IMPLANT
GLOVE SURG ENC MOIS LTX SZ6.5 (GLOVE) ×9 IMPLANT
GLOVE SURG LTX SZ8 (GLOVE) ×3 IMPLANT
GLOVE SURG UNDER POLY LF SZ6.5 (GLOVE) ×3 IMPLANT
GLOVE SURG UNDER POLY LF SZ7 (GLOVE) ×6 IMPLANT
GLOVE SURG UNDER POLY LF SZ8 (GLOVE) ×3
GOWN STRL REUS W/ TWL LRG LVL3 (GOWN DISPOSABLE) ×4 IMPLANT
GOWN STRL REUS W/TWL LRG LVL3 (GOWN DISPOSABLE) ×6
GOWN STRL REUS W/TWL XL LVL3 (GOWN DISPOSABLE) ×6 IMPLANT
KIT POSITIONER SHLDR ASSISTARM (MISCELLANEOUS) ×3 IMPLANT
KIT STABILIZATION SHOULDER (MISCELLANEOUS) IMPLANT
KIT STR SPEAR 1.8 FBRTK DISP (KITS) ×3 IMPLANT
LASSO 90 CVE QUICKPAS (DISPOSABLE) ×3 IMPLANT
LASSO CRESCENT QUICKPASS (SUTURE) IMPLANT
MANIFOLD NEPTUNE II (INSTRUMENTS) ×3 IMPLANT
NDL SAFETY ECLIPSE 18X1.5 (NEEDLE) ×2 IMPLANT
NEEDLE HYPO 18GX1.5 SHARP (NEEDLE) ×3
NEEDLE SCORPION MULTI FIRE (NEEDLE) IMPLANT
PACK ARTHROSCOPY DSU (CUSTOM PROCEDURE TRAY) ×3 IMPLANT
PACK BASIN DAY SURGERY FS (CUSTOM PROCEDURE TRAY) ×3 IMPLANT
PAD COLD SHLDR UNI WRAP-ON (PAD) ×3
PAD COLD SHLDR WRAP-ON (PAD) IMPLANT
PAD COLD UNI WRAP-ON (PAD) ×2 IMPLANT
PAD ORTHO SHOULDER 7X19 LRG (SOFTGOODS) ×6 IMPLANT
PORT APPOLLO RF 90DEGREE MULTI (SURGICAL WAND) ×3 IMPLANT
RESTRAINT HEAD UNIVERSAL NS (MISCELLANEOUS) ×3 IMPLANT
SHEET MEDIUM DRAPE 40X70 STRL (DRAPES) ×3 IMPLANT
SLEEVE SCD COMPRESS KNEE MED (STOCKING) ×3 IMPLANT
SLING ARM FOAM STRAP LRG (SOFTGOODS) IMPLANT
SUT FIBERWIRE #2 38 T-5 BLUE (SUTURE)
SUT MNCRL AB 4-0 PS2 18 (SUTURE) ×3 IMPLANT
SUT PDS AB 1 CT  36 (SUTURE) ×1
SUT PDS AB 1 CT 36 (SUTURE) ×2 IMPLANT
SUT TIGER TAPE 7 IN WHITE (SUTURE) IMPLANT
SUTURE FIBERWR #2 38 T-5 BLUE (SUTURE) IMPLANT
SUTURE TAPE TIGERLINK 1.3MM BL (SUTURE) IMPLANT
SUTURETAPE TIGERLINK 1.3MM BL (SUTURE)
SYR 5ML LL (SYRINGE) ×3 IMPLANT
TAPE FIBER 2MM 7IN #2 BLUE (SUTURE) IMPLANT
TOWEL GREEN STERILE FF (TOWEL DISPOSABLE) ×6 IMPLANT
TUBE CONNECTING 20X1/4 (TUBING) ×3 IMPLANT
TUBING ARTHROSCOPY IRRIG 16FT (MISCELLANEOUS) ×3 IMPLANT

## 2020-04-22 NOTE — Progress Notes (Signed)
Assisted Dr. Bass with left, ultrasound guided, interscalene  block. Side rails up, monitors on throughout procedure. See vital signs in flow sheet. Tolerated Procedure well.  

## 2020-04-22 NOTE — Transfer of Care (Signed)
Immediate Anesthesia Transfer of Care Note  Patient: Abigail Silva  Procedure(s) Performed: SHOULDER ARTHROSCOPY WITH EXTENSIVE DEBRIDEMENT; SUBACROMIAL DECOMPRESSION; PARTIAL ACROMIOPLASTY WITH CORACOACROMIAL RELEASE AND BICEP TENODESIS (Left Shoulder) SHOULDER ARTHROSCOPY WITH DISTAL CLAVICULECTOMY (Left Shoulder)  Patient Location: PACU  Anesthesia Type:GA combined with regional for post-op pain  Level of Consciousness: awake, alert  and oriented  Airway & Oxygen Therapy: Patient Spontanous Breathing and Patient connected to face mask oxygen  Post-op Assessment: Report given to RN and Post -op Vital signs reviewed and stable  Post vital signs: Reviewed and stable  Last Vitals:  Vitals Value Taken Time  BP 123/76 04/22/20 0843  Temp    Pulse 89 04/22/20 0845  Resp 19 04/22/20 0845  SpO2 99 % 04/22/20 0845  Vitals shown include unvalidated device data.  Last Pain:  Vitals:   04/22/20 0627  TempSrc: Oral  PainSc: 5       Patients Stated Pain Goal: 5 (04/22/20 9480)  Complications: No complications documented.

## 2020-04-22 NOTE — Op Note (Signed)
Orthopaedic Surgery Operative Note (CSN: [id number])  [name]  06/19/1980 Date of Surgery: 04/22/2020   Diagnoses:  Left shoulder pain, biceps tendinitis, history of adhesive capsulitis, AC arthrosis and impingement  Procedure: Arthroscopic extensive debridement Arthroscopic subacromial decompression Arthroscopic biceps tenodesis Arthroscopic distal clavicle excision Lysis of adhesions and manipulation under anesthesia   Operative Finding Exam under anesthesia: Patient had full external rotation to about 75 to 80 degrees but forward flexion was limited to about 140 degrees.  Her contralateral side is about 150 or so degrees and further motion seem to come from her scapulothoracic joint rather than the glenohumeral joint. Articular space: No loose bodies, capsule intact, labrum intact, she had no obvious injection of the capsule but there was thickening of the inferior anterior capsule.  Additionally there were atypical adhesions to the biceps. Chondral surfaces:Intact, no sign of chondral degeneration on the glenoid or humeral head Biceps: Atypical adhesions from the cuff to the biceps.  No SLAP tear.  Mild redness with pulling the biceps into the joint. Subscapularis: Mild fraying at the superior aspect consistent with chronic biceps tendinitis. Superior Cuff: Normal Bursal side: Normal  Successful completion of the planned procedure.  Patient's biceps was atypically adherent to her superior cuff.  She had some fraying of the superior aspect of the subscapularis but the anchor itself was intact.  She had a typical motion overall with forward flexion on both sides but she did seem more stiff on the left side.  She is at high risk for stiffness.   Post-operative plan: The patient will be non-weightbearing in a sling for 2 weeks and then transition to out of the sling for aggressive range of motion.  The patient will be discharged home.  DVT prophylaxis not indicated in ambulatory  upper extremity patient without known risk factors.   Pain control with PRN pain medication preferring oral medicines.  Follow up plan will be scheduled in approximately 7 days for incision check and XR.  Post-Op Diagnosis: Same Surgeons:Primary: Bjorn Pippin, MD Assistants:Caroline McBane PA-C Location: [facility] OR ROOM 6 Anesthesia: General with Exparel interscalene block Antibiotics: Ancef 2 g Tourniquet time: None Estimated Blood Loss: Minimal Complications: None Specimens: None Implants: Implant Name Type Inv. Item Serial No. Manufacturer Lot No. LRB No. Used Action  ANCHOR SUT 1.8 FIBERTAK 2 SUT - [id number] Anchor ANCHOR SUT 1.8 FIBERTAK 2 SUT  [employer] [id number] Left 1 Implanted  ANCHOR SUT 1.8 FIBERTAK 2 SUT - [id number] Anchor ANCHOR SUT 1.8 FIBERTAK 2 SUT  [employer] [id number] Left 1 Implanted    Indications for Surgery:   Abigail Silva is a 40 y.o. female with continued shoulder pain refractory to nonoperative measures for extended period of time.  She failed nonoperative measures for extended period of time.  The risks and benefits were explained at length including but not limited to continued pain, cuff failure, biceps tenodesis failure, stiffness, need for further surgery and infection.   Procedure:   Patient was correctly identified in the preoperative holding area and operative site marked.  Patient brought to OR and positioned beachchair on an [place] table ensuring that all bony prominences were padded and the head was in an appropriate location.  Anesthesia was induced and the operative shoulder was prepped and draped in the usual sterile fashion.  Timeout was called preincision.  A standard posterior viewing portal was made after localizing the portal with a spinal needle.  An anterior accessory portal was also made.  After clearing the  articular space the camera was positioned in the subacromial space.  Findings above.    We will begin with a lysis of the anterior  capsule.  We used a arthroscopic ablator to release the anterior capsule carefully just off the glenoid labrum.  Were careful not to extend too far distal.  We release For the Manipulation under Anesthesia and Were Able to Achieve Full Symmetric Motion.  Extensive debridement was performed of the anterior interval tissue, labral fraying and the bursa.  Subacromial decompression: We made a lateral portal with spinal needle guidance. We then proceeded to debride bursal tissue extensively with a shaver and arthrocare device. At that point we continued to identify the borders of the acromion and identify the spur. We then carefully preserved the deltoid fascia and used a burr to convert the acromion to a Type 1 flat acromion without issue.  Biceps tenodesis: We marked the tendon and then performed a tenotomy and debridement of the stump in the articular space. We then identified the biceps tendon in its groove suprapec with the arthroscope in the lateral portal taking care to move from lateral to medial to avoid injury to the subscapularis. At that point we unroofed the tendon itself and mobilized it. An accessory anterior portal was made in line with the tendon and we grasped it from the anterior superior portal and worked from the accessory anterior portal. Two Fibertak 1.42mm knotless anchors were placed in the groove and the tendon was secured in a luggage loop style fashion with a pass of the limb of suture through the tendon using a scorpion device to avoid pull-through.  Repair was completed with good tension on the tendon.  Residual stump of the tendon was removed after being resected with a RF ablator.  Distal Clavicle resection:  The scope was placed in the subacromial space from the posterior portal.  A hemostat was placed through the anterior portal and we spread at the [facility] joint.  A burr was then inserted and 10 mm of distal clavicle was resected taking care to avoid damage to the capsule around the  joint and avoiding overhanging bone posteriorly.     The incisions were closed with absorbable monocryl and steri strips.  A sterile dressing was placed along with a sling. The patient was awoken from general anesthesia and taken to the PACU in stable condition without complication.   Alfonse Alpers, PA-C, present and scrubbed throughout the case, critical for completion in a timely fashion, and for retraction, instrumentation, closure.

## 2020-04-22 NOTE — Anesthesia Postprocedure Evaluation (Signed)
Anesthesia Post Note  Patient: Abigail Silva  Procedure(s) Performed: SHOULDER ARTHROSCOPY WITH EXTENSIVE DEBRIDEMENT; SUBACROMIAL DECOMPRESSION; PARTIAL ACROMIOPLASTY WITH CORACOACROMIAL RELEASE AND BICEP TENODESIS (Left Shoulder) SHOULDER ARTHROSCOPY WITH DISTAL CLAVICULECTOMY (Left Shoulder)     Patient location during evaluation: PACU Anesthesia Type: Regional and General Level of consciousness: awake and alert Pain management: pain level controlled Vital Signs Assessment: post-procedure vital signs reviewed and stable Respiratory status: spontaneous breathing, nonlabored ventilation and respiratory function stable Cardiovascular status: blood pressure returned to baseline and stable Postop Assessment: no apparent nausea or vomiting Anesthetic complications: no   No complications documented.  Last Vitals:  Vitals:   04/22/20 0900 04/22/20 0923  BP: 120/71 120/65  Pulse: 90 80  Resp: 14 17  Temp:  36.7 C  SpO2: 94% 95%    Last Pain:  Vitals:   04/22/20 0923  TempSrc:   PainSc: 0-No pain                 Mellody Dance

## 2020-04-22 NOTE — Discharge Instructions (Signed)
Ramond Marrow MD, MPH Alfonse Alpers, PA-C [facility] [address], [address] [phone] (tel)   [phone] (fax)   POST-OPERATIVE INSTRUCTIONS - SHOULDER ARTHROSCOPY  WOUND CARE . You may remove the Operative Dressing on Post-Op Day #3 (72hrs after surgery).   . Alternatively if you would like you can leave dressing on until follow-up if within 7-8 days but keep it dry. . Leave steri-strips in place until they fall off on their own, usually 2 weeks postop. . There may be a small amount of fluid/bleeding leaking at the surgical site.  o This is normal; the shoulder is filled with fluid during the procedure and can leak for 24-48hrs after surgery.  . You may change/reinforce the bandage as needed.  . Use the Cryocuff or Ice as often as possible for the first 7 days, then as needed for pain relief. Always keep a towel, ACE wrap or other barrier between the cooling unit and your skin.  . You may shower on Post-Op Day #3. Gently pat the area dry. Do not soak the shoulder in water or submerge it. Keep dry incisions as dry as possible. . Do not go swimming in the pool or ocean until 4 weeks after surgery or when otherwise instructed.    EXERCISES/BRACING ? Sling should be used at all times until follow-up. (including when you sleep) ? You can remove sling for hygiene/physical therapy.    . Please continue to ambulate and do not stay sitting or lying for too long. Perform foot and wrist pumps to assist in circulation.  POST-OP MEDICATIONS- Multimodal approach to pain control . In general your pain will be controlled with a combination of substances.  Prescriptions unless otherwise discussed are electronically sent to your pharmacy.  This is a carefully made plan we use to minimize narcotic use.     ? Celebrex - Anti-inflammatory medication taken on a scheduled basis - Take 1 tablet twice a day ? Acetaminophen - Non-narcotic pain medicine taken on a scheduled basis   - Take two 500 mg tablets (1,000mg  total) every 8 hours    NEXT DOSE 12:30 PM. ? Oxycodone - This is a strong narcotic, to be used only on an "as needed" basis for severe pain. ? Robaxin - this is a muscle relaxer, take as needed for muscle spasms ? Zofran - take as needed for nausea   FOLLOW-UP . If you develop a Fever (?101.5), Redness or Drainage from the surgical incision site, please call our office to arrange for an evaluation. . Please call the office to schedule a follow-up appointment for 10-14 days post-operatively, if you have not already done so.    HELPFUL INFORMATION  . If you had a block, it will wear off between 8-24 hrs postop typically.  This is period when your pain may go from nearly zero to the pain you would have had postop without the block.  This is an abrupt transition but nothing dangerous is happening.  You may take an extra dose of narcotic when this happens.  . You may be more comfortable sleeping in a semi-seated position the first few nights following surgery.  Keep a pillow propped under the elbow and forearm for comfort.  If you have a recliner type of chair it might be beneficial.  If not that is fine too, but it would be helpful to sleep propped up with pillows behind your operated shoulder as well under your elbow and forearm.  This will reduce  pulling on the suture lines.  . When dressing, put your operative arm in the sleeve first.  When getting undressed, take your operative arm out last.  Loose fitting, button-down shirts are recommended.  Often in the first days after surgery you may be more comfortable keeping your operative arm under your shirt and not through the sleeve.  . You may return to work/school in the next couple of days when you feel up to it.  Desk work and typing in the sling is fine.  . We suggest you use the pain medication the first night prior to going to bed, in order to ease any pain when the anesthesia wears off. You should avoid  taking pain medications on an empty stomach as it will make you nauseous.  . You should wean off your narcotic medicines as soon as you are able.  Most patients will be off or using minimal narcotics before their first postop appointment.   . Do not drink alcoholic beverages or take illicit drugs when taking pain medications.  . It is against the law to drive while taking narcotics.  In some states it is against the law to drive while your arm is in a sling.   . Pain medication may make you constipated.  Below are a few solutions to try in this order: - Decrease the amount of pain medication if you aren't having pain. - Drink lots of decaffeinated fluids. - Drink prune juice and/or eat dried prunes  . If the first 3 don't work start with additional solutions - Take Colace - an over-the-counter stool softener - Take Senokot - an over-the-counter laxative - Take Miralax - a stronger over-the-counter laxative  For more information including helpful videos and documents visit our website:   [email or web address]     Post Anesthesia Home Care Instructions  Activity: Get plenty of rest for the remainder of the day. A responsible individual must stay with you for 24 hours following the procedure.  For the next 24 hours, DO NOT: -Drive a car -Advertising copywriter -Drink alcoholic beverages -Take any medication unless instructed by your physician -Make any legal decisions or sign important papers.  Meals: Start with liquid foods such as gelatin or soup. Progress to regular foods as tolerated. Avoid greasy, spicy, heavy foods. If nausea and/or vomiting occur, drink only clear liquids until the nausea and/or vomiting subsides. Call your physician if vomiting continues.  Special Instructions/Symptoms: Your throat may feel dry or sore from the anesthesia or the breathing tube placed in your throat during surgery. If this causes discomfort, gargle with warm salt  water. The discomfort should disappear within 24 hours.  If you had a scopolamine patch placed behind your ear for the management of post- operative nausea and/or vomiting:  1. The medication in the patch is effective for 72 hours, after which it should be removed.  Wrap patch in a tissue and discard in the trash. Wash hands thoroughly with soap and water. 2. You may remove the patch earlier than 72 hours if you experience unpleasant side effects which may include dry mouth, dizziness or visual disturbances. 3. Avoid touching the patch. Wash your hands with soap and water after contact with the patch.    Regional Anesthesia Blocks  1. Numbness or the inability to move the "blocked" extremity may last from 3-48 hours after placement. The length of time depends on the medication injected and your individual response to the medication. If the numbness is not going away after  48 hours, call your surgeon.  2. The extremity that is blocked will need to be protected until the numbness is gone and the  Strength has returned. Because you cannot feel it, you will need to take extra care to avoid injury. Because it may be weak, you may have difficulty moving it or using it. You may not know what position it is in without looking at it while the block is in effect.  3. For blocks in the legs and feet, returning to weight bearing and walking needs to be done carefully. You will need to wait until the numbness is entirely gone and the strength has returned. You should be able to move your leg and foot normally before you try and bear weight or walk. You will need someone to be with you when you first try to ensure you do not fall and possibly risk injury.  4. Bruising and tenderness at the needle site are common side effects and will resolve in a few days.  5. Persistent numbness or new problems with movement should be communicated to the surgeon or the [facility] [phone] [facility] [phone]). Information for Discharge Teaching: EXPAREL (bupivacaine liposome injectable suspension)   Your surgeon or anesthesiologist gave you EXPAREL(bupivacaine) to help control your pain after surgery.   EXPAREL is a local anesthetic that provides pain relief by numbing the tissue around the surgical site.  EXPAREL is designed to release pain medication over time and can control pain for up to 72 hours.  Depending on how you respond to EXPAREL, you may require less pain medication during your recovery.  Possible side effects:  Temporary loss of sensation or ability to move in the area where bupivacaine was injected.  Nausea, vomiting, constipation  Rarely, numbness and tingling in your mouth or lips, lightheadedness, or anxiety may occur.  Call your doctor right away if you think you may be experiencing any of these sensations, or if you have other questions regarding possible side effects.  Follow all other discharge instructions given to you by your surgeon or nurse. Eat a healthy diet and drink plenty of water or other fluids.  If you return to the hospital for any reason within 96 hours following the administration of EXPAREL, it is important for health care providers to know that you have received this anesthetic. A teal colored band has been placed on your arm with the date, time and amount of EXPAREL you have received in order to alert and inform your health care providers. Please leave this armband in place for the full 96 hours following administration, and then you may remove the band.

## 2020-04-22 NOTE — Interval H&P Note (Signed)
History and Physical Interval Note:  04/22/2020 7:09 AM  Abigail Silva  has presented today for surgery, with the diagnosis of LEFT SHOULDER OSTEOARTHRITIS, IMPINGEMENT SYNDROME, BICIPITAL TENDINITIS, ADHESIVE CAPSULITIS.  The various methods of treatment have been discussed with the patient and family. After consideration of risks, benefits and other options for treatment, the patient has consented to  Procedure(s): SHOULDER ARTHROSCOPY WITH EXTENSIVE DEBRIDEMENT; SUBACROMIAL DECOMPRESSION; PARTIAL ACROMIOPLASTY WITH CORACOACROMIAL RELEASE AND BICEP TENODESIS (Left) SHOULDER ARTHROSCOPY WITH DISTAL CLAVICULECTOMY (Left) as a surgical intervention.  The patient's history has been reviewed, patient examined, no change in status, stable for surgery.  I have reviewed the patient's chart and labs.  Questions were answered to the patient's satisfaction.     Bjorn Pippin

## 2020-04-22 NOTE — Anesthesia Procedure Notes (Signed)
Procedure Name: Intubation Performed by: Shatiqua Heroux S, CRNA Pre-anesthesia Checklist: Patient identified, Emergency Drugs available, Suction available and Patient being monitored Patient Re-evaluated:Patient Re-evaluated prior to induction Oxygen Delivery Method: Circle System Utilized Preoxygenation: Pre-oxygenation with 100% oxygen Induction Type: IV induction Ventilation: Mask ventilation without difficulty Laryngoscope Size: Mac and 3 Grade View: Grade I Tube type: Oral Tube size: 7.0 mm Number of attempts: 1 Airway Equipment and Method: Stylet and Oral airway Placement Confirmation: ETT inserted through vocal cords under direct vision,  positive ETCO2 and breath sounds checked- equal and bilateral Secured at: 21 cm Tube secured with: Tape Dental Injury: Teeth and Oropharynx as per pre-operative assessment        

## 2020-04-22 NOTE — Anesthesia Procedure Notes (Signed)
Anesthesia Regional Block: Interscalene brachial plexus block   Pre-Anesthetic Checklist: ,, timeout performed, Correct Patient, Correct Site, Correct Laterality, Correct Procedure, Correct Position, site marked, Risks and benefits discussed,  Surgical consent,  Pre-op evaluation,  At surgeon's request and post-op pain management  Laterality: Left  Prep: chloraprep       Needles:  Injection technique: Single-shot  Needle Type: Echogenic Stimulator Needle     Needle Length: 5cm  Needle Gauge: 22     Additional Needles:   Procedures:,,,, ultrasound used (permanent image in chart),,,,  Narrative:  Start time: 04/22/2020 6:45 AM End time: 04/22/2020 6:50 AM Injection made incrementally with aspirations every 5 mL.  Performed by: Personally  Anesthesiologist: Mellody Dance, MD  Additional Notes: Functioning IV was confirmed and monitors applied.  A 74mm 22ga echogenic arrow stimulator was used. Sterile prep and drape,hand hygiene and sterile gloves were used.Ultrasound guidance: relevant anatomy identified, needle position confirmed, local anesthetic spread visualized around nerve(s)., vascular puncture avoided.  Image printed for medical record.  Negative aspiration and negative test dose prior to incremental administration of local anesthetic. The patient tolerated the procedure well.

## 2020-04-23 ENCOUNTER — Ambulatory Visit: Payer: 59 | Admitting: Physician Assistant

## 2020-04-23 ENCOUNTER — Encounter (HOSPITAL_BASED_OUTPATIENT_CLINIC_OR_DEPARTMENT_OTHER): Payer: Self-pay | Admitting: Orthopaedic Surgery

## 2020-04-23 DIAGNOSIS — M25612 Stiffness of left shoulder, not elsewhere classified: Secondary | ICD-10-CM | POA: Diagnosis not present

## 2020-04-23 DIAGNOSIS — M7502 Adhesive capsulitis of left shoulder: Secondary | ICD-10-CM | POA: Diagnosis not present

## 2020-04-23 DIAGNOSIS — M7542 Impingement syndrome of left shoulder: Secondary | ICD-10-CM | POA: Diagnosis not present

## 2020-04-26 DIAGNOSIS — M7542 Impingement syndrome of left shoulder: Secondary | ICD-10-CM | POA: Diagnosis not present

## 2020-04-26 DIAGNOSIS — M25512 Pain in left shoulder: Secondary | ICD-10-CM | POA: Diagnosis not present

## 2020-04-26 DIAGNOSIS — M7502 Adhesive capsulitis of left shoulder: Secondary | ICD-10-CM | POA: Diagnosis not present

## 2020-04-26 DIAGNOSIS — M25612 Stiffness of left shoulder, not elsewhere classified: Secondary | ICD-10-CM | POA: Diagnosis not present

## 2020-04-28 DIAGNOSIS — M7502 Adhesive capsulitis of left shoulder: Secondary | ICD-10-CM | POA: Diagnosis not present

## 2020-04-28 DIAGNOSIS — M25612 Stiffness of left shoulder, not elsewhere classified: Secondary | ICD-10-CM | POA: Diagnosis not present

## 2020-04-28 DIAGNOSIS — M25512 Pain in left shoulder: Secondary | ICD-10-CM | POA: Diagnosis not present

## 2020-04-28 DIAGNOSIS — M7542 Impingement syndrome of left shoulder: Secondary | ICD-10-CM | POA: Diagnosis not present

## 2020-04-30 DIAGNOSIS — M7502 Adhesive capsulitis of left shoulder: Secondary | ICD-10-CM | POA: Diagnosis not present

## 2020-04-30 DIAGNOSIS — M25512 Pain in left shoulder: Secondary | ICD-10-CM | POA: Diagnosis not present

## 2020-04-30 DIAGNOSIS — M25612 Stiffness of left shoulder, not elsewhere classified: Secondary | ICD-10-CM | POA: Diagnosis not present

## 2020-04-30 DIAGNOSIS — M7542 Impingement syndrome of left shoulder: Secondary | ICD-10-CM | POA: Diagnosis not present

## 2020-05-03 DIAGNOSIS — M7542 Impingement syndrome of left shoulder: Secondary | ICD-10-CM | POA: Diagnosis not present

## 2020-05-03 DIAGNOSIS — M7502 Adhesive capsulitis of left shoulder: Secondary | ICD-10-CM | POA: Diagnosis not present

## 2020-05-03 DIAGNOSIS — M25512 Pain in left shoulder: Secondary | ICD-10-CM | POA: Diagnosis not present

## 2020-05-03 DIAGNOSIS — M25612 Stiffness of left shoulder, not elsewhere classified: Secondary | ICD-10-CM | POA: Diagnosis not present

## 2020-05-05 DIAGNOSIS — M7542 Impingement syndrome of left shoulder: Secondary | ICD-10-CM | POA: Diagnosis not present

## 2020-05-05 DIAGNOSIS — M25612 Stiffness of left shoulder, not elsewhere classified: Secondary | ICD-10-CM | POA: Diagnosis not present

## 2020-05-05 DIAGNOSIS — M25512 Pain in left shoulder: Secondary | ICD-10-CM | POA: Diagnosis not present

## 2020-05-05 DIAGNOSIS — M7502 Adhesive capsulitis of left shoulder: Secondary | ICD-10-CM | POA: Diagnosis not present

## 2020-05-06 DIAGNOSIS — M7542 Impingement syndrome of left shoulder: Secondary | ICD-10-CM | POA: Diagnosis not present

## 2020-05-06 DIAGNOSIS — M25512 Pain in left shoulder: Secondary | ICD-10-CM | POA: Diagnosis not present

## 2020-05-06 DIAGNOSIS — M7502 Adhesive capsulitis of left shoulder: Secondary | ICD-10-CM | POA: Diagnosis not present

## 2020-05-06 DIAGNOSIS — M25612 Stiffness of left shoulder, not elsewhere classified: Secondary | ICD-10-CM | POA: Diagnosis not present

## 2020-05-12 DIAGNOSIS — M25612 Stiffness of left shoulder, not elsewhere classified: Secondary | ICD-10-CM | POA: Diagnosis not present

## 2020-05-12 DIAGNOSIS — M7542 Impingement syndrome of left shoulder: Secondary | ICD-10-CM | POA: Diagnosis not present

## 2020-05-12 DIAGNOSIS — M25512 Pain in left shoulder: Secondary | ICD-10-CM | POA: Diagnosis not present

## 2020-05-12 DIAGNOSIS — M7502 Adhesive capsulitis of left shoulder: Secondary | ICD-10-CM | POA: Diagnosis not present

## 2020-05-14 DIAGNOSIS — M25512 Pain in left shoulder: Secondary | ICD-10-CM | POA: Diagnosis not present

## 2020-05-14 DIAGNOSIS — M7542 Impingement syndrome of left shoulder: Secondary | ICD-10-CM | POA: Diagnosis not present

## 2020-05-14 DIAGNOSIS — M25612 Stiffness of left shoulder, not elsewhere classified: Secondary | ICD-10-CM | POA: Diagnosis not present

## 2020-05-14 DIAGNOSIS — M7502 Adhesive capsulitis of left shoulder: Secondary | ICD-10-CM | POA: Diagnosis not present

## 2020-05-17 DIAGNOSIS — M25512 Pain in left shoulder: Secondary | ICD-10-CM | POA: Diagnosis not present

## 2020-05-17 DIAGNOSIS — M25612 Stiffness of left shoulder, not elsewhere classified: Secondary | ICD-10-CM | POA: Diagnosis not present

## 2020-05-17 DIAGNOSIS — M7502 Adhesive capsulitis of left shoulder: Secondary | ICD-10-CM | POA: Diagnosis not present

## 2020-05-17 DIAGNOSIS — M7542 Impingement syndrome of left shoulder: Secondary | ICD-10-CM | POA: Diagnosis not present

## 2020-05-20 DIAGNOSIS — M7542 Impingement syndrome of left shoulder: Secondary | ICD-10-CM | POA: Diagnosis not present

## 2020-05-20 DIAGNOSIS — M7502 Adhesive capsulitis of left shoulder: Secondary | ICD-10-CM | POA: Diagnosis not present

## 2020-05-20 DIAGNOSIS — M25512 Pain in left shoulder: Secondary | ICD-10-CM | POA: Diagnosis not present

## 2020-05-20 DIAGNOSIS — M25612 Stiffness of left shoulder, not elsewhere classified: Secondary | ICD-10-CM | POA: Diagnosis not present

## 2020-05-25 DIAGNOSIS — M25512 Pain in left shoulder: Secondary | ICD-10-CM | POA: Diagnosis not present

## 2020-05-26 DIAGNOSIS — M7502 Adhesive capsulitis of left shoulder: Secondary | ICD-10-CM | POA: Diagnosis not present

## 2020-05-26 DIAGNOSIS — M25512 Pain in left shoulder: Secondary | ICD-10-CM | POA: Diagnosis not present

## 2020-05-26 DIAGNOSIS — M7542 Impingement syndrome of left shoulder: Secondary | ICD-10-CM | POA: Diagnosis not present

## 2020-05-26 DIAGNOSIS — M25612 Stiffness of left shoulder, not elsewhere classified: Secondary | ICD-10-CM | POA: Diagnosis not present

## 2020-05-28 DIAGNOSIS — M25612 Stiffness of left shoulder, not elsewhere classified: Secondary | ICD-10-CM | POA: Diagnosis not present

## 2020-05-28 DIAGNOSIS — M7542 Impingement syndrome of left shoulder: Secondary | ICD-10-CM | POA: Diagnosis not present

## 2020-05-28 DIAGNOSIS — M7502 Adhesive capsulitis of left shoulder: Secondary | ICD-10-CM | POA: Diagnosis not present

## 2020-05-28 DIAGNOSIS — M25512 Pain in left shoulder: Secondary | ICD-10-CM | POA: Diagnosis not present

## 2020-06-01 DIAGNOSIS — M25612 Stiffness of left shoulder, not elsewhere classified: Secondary | ICD-10-CM | POA: Diagnosis not present

## 2020-06-01 DIAGNOSIS — M25512 Pain in left shoulder: Secondary | ICD-10-CM | POA: Diagnosis not present

## 2020-06-01 DIAGNOSIS — M7542 Impingement syndrome of left shoulder: Secondary | ICD-10-CM | POA: Diagnosis not present

## 2020-06-01 DIAGNOSIS — M7502 Adhesive capsulitis of left shoulder: Secondary | ICD-10-CM | POA: Diagnosis not present

## 2020-06-08 DIAGNOSIS — M25512 Pain in left shoulder: Secondary | ICD-10-CM | POA: Diagnosis not present

## 2020-06-08 DIAGNOSIS — M7502 Adhesive capsulitis of left shoulder: Secondary | ICD-10-CM | POA: Diagnosis not present

## 2020-06-08 DIAGNOSIS — M7542 Impingement syndrome of left shoulder: Secondary | ICD-10-CM | POA: Diagnosis not present

## 2020-06-08 DIAGNOSIS — M25612 Stiffness of left shoulder, not elsewhere classified: Secondary | ICD-10-CM | POA: Diagnosis not present

## 2020-06-18 DIAGNOSIS — M25512 Pain in left shoulder: Secondary | ICD-10-CM | POA: Diagnosis not present

## 2020-06-18 DIAGNOSIS — M25612 Stiffness of left shoulder, not elsewhere classified: Secondary | ICD-10-CM | POA: Diagnosis not present

## 2020-06-18 DIAGNOSIS — M7502 Adhesive capsulitis of left shoulder: Secondary | ICD-10-CM | POA: Diagnosis not present

## 2020-06-18 DIAGNOSIS — M7542 Impingement syndrome of left shoulder: Secondary | ICD-10-CM | POA: Diagnosis not present

## 2020-06-29 DIAGNOSIS — M7542 Impingement syndrome of left shoulder: Secondary | ICD-10-CM | POA: Diagnosis not present

## 2020-06-29 DIAGNOSIS — M25512 Pain in left shoulder: Secondary | ICD-10-CM | POA: Diagnosis not present

## 2020-06-29 DIAGNOSIS — M7502 Adhesive capsulitis of left shoulder: Secondary | ICD-10-CM | POA: Diagnosis not present

## 2020-06-29 DIAGNOSIS — M25612 Stiffness of left shoulder, not elsewhere classified: Secondary | ICD-10-CM | POA: Diagnosis not present

## 2020-07-01 DIAGNOSIS — M25512 Pain in left shoulder: Secondary | ICD-10-CM | POA: Diagnosis not present

## 2020-07-12 DIAGNOSIS — M7542 Impingement syndrome of left shoulder: Secondary | ICD-10-CM | POA: Diagnosis not present

## 2020-07-12 DIAGNOSIS — M25512 Pain in left shoulder: Secondary | ICD-10-CM | POA: Diagnosis not present

## 2020-07-12 DIAGNOSIS — M25612 Stiffness of left shoulder, not elsewhere classified: Secondary | ICD-10-CM | POA: Diagnosis not present

## 2020-07-12 DIAGNOSIS — M7502 Adhesive capsulitis of left shoulder: Secondary | ICD-10-CM | POA: Diagnosis not present

## 2020-07-19 DIAGNOSIS — M25612 Stiffness of left shoulder, not elsewhere classified: Secondary | ICD-10-CM | POA: Diagnosis not present

## 2020-07-19 DIAGNOSIS — M7542 Impingement syndrome of left shoulder: Secondary | ICD-10-CM | POA: Diagnosis not present

## 2020-07-19 DIAGNOSIS — M25512 Pain in left shoulder: Secondary | ICD-10-CM | POA: Diagnosis not present

## 2020-07-19 DIAGNOSIS — M7502 Adhesive capsulitis of left shoulder: Secondary | ICD-10-CM | POA: Diagnosis not present

## 2020-07-27 DIAGNOSIS — M25612 Stiffness of left shoulder, not elsewhere classified: Secondary | ICD-10-CM | POA: Diagnosis not present

## 2020-07-27 DIAGNOSIS — M25512 Pain in left shoulder: Secondary | ICD-10-CM | POA: Diagnosis not present

## 2020-07-27 DIAGNOSIS — M7502 Adhesive capsulitis of left shoulder: Secondary | ICD-10-CM | POA: Diagnosis not present

## 2020-07-27 DIAGNOSIS — M7542 Impingement syndrome of left shoulder: Secondary | ICD-10-CM | POA: Diagnosis not present

## 2021-03-30 ENCOUNTER — Encounter: Payer: 59 | Admitting: Physician Assistant

## 2021-04-21 ENCOUNTER — Encounter: Payer: Self-pay | Admitting: Physician Assistant

## 2021-04-21 DIAGNOSIS — E785 Hyperlipidemia, unspecified: Secondary | ICD-10-CM | POA: Insufficient documentation

## 2021-04-21 NOTE — Progress Notes (Incomplete)
? ?SCRIBE STATEMENT ? ? ?Subjective:  ?  ?Abigail Silva is a 41 y.o. female and is here for a comprehensive physical exam. ? ? ?HPI ? ?Health Maintenance Due  ?Topic Date Due  ? Hepatitis C Screening  Never done  ? PAP SMEAR-Modifier  Never done  ? COVID-19 Vaccine (4 - Booster for Moderna series) 02/16/2020  ? ? ?Acute Concerns: ? ? ?Chronic Issues: ? ? ?Health Maintenance: ?Immunizations -- Covid- [id number] ?Influenza-  Due; 2021 ?Tdap- UTD;2015 ?Colonoscopy -- N/A ?Mammogram -- Due ?PAP -- Due ?Bone Density -- N/A ?Diet -- *** ?Sleep habits -- *** ?Exercise -- *** ?Weight -- Stable ?Mood -- *** ?Weight history: ?Wt Readings from Last 10 Encounters:  ?04/22/20 160 lb 15 oz (73 kg)  ?03/05/20 161 lb 6.1 oz (73.2 kg)  ?10/04/18 157 lb (71.2 kg)  ?09/25/18 157 lb (71.2 kg)  ? ?There is no height or weight on file to calculate BMI. ?No LMP recorded. ?Alcohol use:  reports current alcohol use of about 2.0 standard drinks per week. ?Tobacco use:  ?Tobacco Use: Low Risk   ? Smoking Tobacco Use: Never  ? Smokeless Tobacco Use: Never  ? Passive Exposure: Not on file  ? ? ? ? ?  10/04/2018  ?  9:12 AM  ?Depression screen PHQ 2/9  ?Decreased Interest 0  ?Down, Depressed, Hopeless 0  ?PHQ - 2 Score 0  ? ? ? ?Other providers/specialists: ?Patient Care Team: ?Inda Coke, PA as PCP - General (Physician Assistant)  ? ? ?PMHx, SurgHx, SocialHx, Medications, and Allergies were reviewed in the Visit Navigator and updated as appropriate.  ? ?Past Medical History:  ?Diagnosis Date  ? Frequent headaches   ? History of chicken pox   ? ? ? ?Past Surgical History:  ?Procedure Laterality Date  ? BICEPT TENODESIS Left 04/22/2020  ? Procedure: SHOULDER ARTHROSCOPY WITH EXTENSIVE DEBRIDEMENT; SUBACROMIAL DECOMPRESSION; PARTIAL ACROMIOPLASTY WITH CORACOACROMIAL RELEASE AND BICEP TENODESIS;  Surgeon: Hiram Gash, MD;  Location: [place];  Service: Orthopedics;  Laterality: Left;  ? CESAREAN SECTION  2011, 2015  ?  SHOULDER ARTHROSCOPY WITH ROTATOR CUFF REPAIR AND SUBACROMIAL DECOMPRESSION Left 04/22/2020  ? Procedure: SHOULDER ARTHROSCOPY WITH DISTAL CLAVICULECTOMY;  Surgeon: Hiram Gash, MD;  Location: [place];  Service: Orthopedics;  Laterality: Left;  ? ? ? ?Family History  ?Problem Relation Age of Onset  ? Uterine cancer Mother   ? Breast cancer Maternal Grandmother   ? Colon cancer Neg Hx   ? ? ?Social History  ? ?Tobacco Use  ? Smoking status: Never  ? Smokeless tobacco: Never  ?Vaping Use  ? Vaping Use: Never used  ?Substance Use Topics  ? Alcohol use: Yes  ?  Alcohol/week: 2.0 standard drinks  ?  Types: 2 Glasses of wine per week  ?  Comment: occ  ? Drug use: Never  ? ? ?Review of Systems:  ? ?ROS ?Negative unless otherwise specified per HPI. ?Objective:  ? ?There were no vitals taken for this visit. ?There is no height or weight on file to calculate BMI. ? ? ?General Appearance:    Alert, cooperative, no distress, appears stated age  ?Head:    Normocephalic, without obvious abnormality, atraumatic  ?Eyes:    PERRL, conjunctiva/corneas clear, EOM's intact, fundi  ?  benign, both eyes  ?Ears:    Normal TM's and external ear canals, both ears  ?Nose:   Nares normal, septum midline, mucosa normal, no drainage    or  sinus tenderness  ?Throat:   Lips, mucosa, and tongue normal; teeth and gums normal  ?Neck:   Supple, symmetrical, trachea midline, no adenopathy;  ?  thyroid:  no enlargement/tenderness/nodules; no carotid ?  bruit or JVD  ?Back:     Symmetric, no curvature, ROM normal, no CVA tenderness  ?Lungs:     Clear to auscultation bilaterally, respirations unlabored  ?Chest Wall:    No tenderness or deformity  ? Heart:    Regular rate and rhythm, S1 and S2 normal, no murmur, rub or gallop  ?Breast Exam:    No tenderness, masses, or nipple abnormality  ?Abdomen:     Soft, non-tender, bowel sounds active all four quadrants,  ?  no masses, no organomegaly  ?Genitalia:    Normal female without lesion,  discharge or tenderness  ?Extremities:   Extremities normal, atraumatic, no cyanosis or edema  ?Pulses:   2+ and symmetric all extremities  ?Skin:   Skin color, texture, turgor normal, no rashes or lesions  ?Lymph nodes:   Cervical, supraclavicular, and axillary nodes normal  ?Neurologic:   CNII-XII intact, normal strength, sensation and reflexes  ?  throughout  ? ? ?Assessment/Plan:  ?Routine Physical Examination ?Today patient counseled on age appropriate routine health concerns for screening and prevention, each reviewed and up to date or declined. Immunizations reviewed and up to date or declined. Labs ordered and reviewed. Risk factors for depression reviewed and negative. Hearing function and visual acuity are intact. ADLs screened and addressed as needed. Functional ability and level of safety reviewed and appropriate. Education, counseling and referrals performed based on assessed risks today. Patient provided with a copy of personalized plan for preventive services. ? ?Encounter for screening for vascular disease ?Update lipid panel/profile, will start medication as indicated by results  ? ? ? ? ?Patient Counseling: ?[x]    Nutrition: Stressed importance of moderation in sodium/caffeine intake, saturated fat and cholesterol, caloric balance, sufficient intake of fresh fruits, vegetables, fiber, calcium, iron, and 1 mg of folate supplement per day (for females capable of pregnancy).  ?[x]    Stressed the importance of regular exercise.   ?[x]    Substance Abuse: Discussed cessation/primary prevention of tobacco, alcohol, or other drug use; driving or other dangerous activities under the influence; availability of treatment for abuse.   ?[x]    Injury prevention: Discussed safety belts, safety helmets, smoke detector, smoking near bedding or upholstery.   ?[x]    Sexuality: Discussed sexually transmitted diseases, partner selection, use of condoms, avoidance of unintended pregnancy  and contraceptive alternatives.   ?[x]    Dental health: Discussed importance of regular tooth brushing, flossing, and dental visits.  ?[x]    Health maintenance and immunizations reviewed. Please refer to Health maintenance section.  ? ?I,Havlyn C Ratchford,acting as a scribe for [employer], PA.,have documented all relevant documentation on the behalf of Inda Coke, PA,as directed by  Inda Coke, PA while in the presence of Inda Coke, [place]. ? ?*** ? ?Inda Coke, PA-C ?[place] ? ? ? ? ? ? ? ? ?

## 2021-04-22 ENCOUNTER — Encounter: Payer: Self-pay | Admitting: Physician Assistant

## 2021-04-22 ENCOUNTER — Other Ambulatory Visit (HOSPITAL_COMMUNITY)
Admission: RE | Admit: 2021-04-22 | Discharge: 2021-04-22 | Disposition: A | Discharge status: Home / Self Care | Payer: 59 | Source: Ambulatory Visit | Attending: Physician Assistant | Admitting: Physician Assistant

## 2021-04-22 ENCOUNTER — Ambulatory Visit (INDEPENDENT_AMBULATORY_CARE_PROVIDER_SITE_OTHER): Payer: 59 | Admitting: Physician Assistant

## 2021-04-22 VITALS — BP 120/78 | HR 70 | Temp 98.3°F | Ht 63.0 in | Wt 172.0 lb

## 2021-04-22 DIAGNOSIS — Z124 Encounter for screening for malignant neoplasm of cervix: Secondary | ICD-10-CM | POA: Diagnosis not present

## 2021-04-22 DIAGNOSIS — E669 Obesity, unspecified: Secondary | ICD-10-CM | POA: Diagnosis not present

## 2021-04-22 DIAGNOSIS — Z1159 Encounter for screening for other viral diseases: Secondary | ICD-10-CM | POA: Diagnosis not present

## 2021-04-22 DIAGNOSIS — Z0000 Encounter for general adult medical examination without abnormal findings: Secondary | ICD-10-CM

## 2021-04-22 DIAGNOSIS — E785 Hyperlipidemia, unspecified: Secondary | ICD-10-CM | POA: Diagnosis not present

## 2021-04-22 LAB — COMPREHENSIVE METABOLIC PANEL
ALT: 12 U/L (ref 0–35)
AST: 19 U/L (ref 0–37)
Albumin: 4.7 g/dL (ref 3.5–5.2)
Alkaline Phosphatase: 42 U/L (ref 39–117)
BUN: 13 mg/dL (ref 6–23)
CO2: 24 mEq/L (ref 19–32)
Calcium: 9.6 mg/dL (ref 8.4–10.5)
Chloride: 103 mEq/L (ref 96–112)
Creatinine, Ser: 0.81 mg/dL (ref 0.40–1.20)
GFR: 90.61 mL/min (ref >60.00)
Glucose, Bld: 92 mg/dL (ref 70–99)
Potassium: 4 mEq/L (ref 3.5–5.1)
Sodium: 137 mEq/L (ref 135–145)
Total Bilirubin: 0.7 mg/dL (ref 0.2–1.2)
Total Protein: 7.9 g/dL (ref 6.0–8.3)

## 2021-04-22 LAB — CBC WITH DIFFERENTIAL/PLATELET
Basophils Absolute: 0 10*3/uL (ref 0.0–0.1)
Basophils Relative: 0.5 % (ref 0.0–3.0)
Eosinophils Absolute: 0.1 10*3/uL (ref 0.0–0.7)
Eosinophils Relative: 1.9 % (ref 0.0–5.0)
HCT: 41.6 % (ref 36.0–46.0)
Hemoglobin: 13.7 g/dL (ref 12.0–15.0)
Lymphocytes Relative: 27.5 % (ref 12.0–46.0)
Lymphs Abs: 1.9 10*3/uL (ref 0.7–4.0)
MCHC: 32.9 g/dL (ref 30.0–36.0)
MCV: 85.1 fl (ref 78.0–100.0)
Monocytes Absolute: 0.5 10*3/uL (ref 0.1–1.0)
Monocytes Relative: 8 % (ref 3.0–12.0)
Neutro Abs: 4.3 10*3/uL (ref 1.4–7.7)
Neutrophils Relative %: 62.1 % (ref 43.0–77.0)
Platelets: 276 10*3/uL (ref 150.0–400.0)
RBC: 4.89 Mil/uL (ref 3.87–5.11)
RDW: 13.7 % (ref 11.5–15.5)
WBC: 6.9 10*3/uL (ref 4.0–10.5)

## 2021-04-22 LAB — LIPID PANEL
Cholesterol: 206 mg/dL — ABNORMAL HIGH (ref 0–200)
HDL: 51.6 mg/dL (ref >39.00)
LDL Cholesterol: 138 mg/dL — ABNORMAL HIGH (ref 0–99)
NonHDL: 154.72
Total CHOL/HDL Ratio: 4
Triglycerides: 84 mg/dL (ref 0.0–149.0)
VLDL: 16.8 mg/dL (ref 0.0–40.0)

## 2021-04-22 NOTE — Progress Notes (Signed)
? ?Subjective:  ?  ?JAESHA BARTOLOMUCCI is a 41 y.o. female and is here for a comprehensive physical exam. ? ? ?HPI ? ?Health Maintenance Due  ?Topic Date Due  ? Hepatitis C Screening  Never done  ? PAP SMEAR-Modifier  Never done  ? ? ?Acute Concerns: ?None ? ?Chronic Issues: ?HLD - currently not on any medications; no strong family hx of heart disease ? ?Health Maintenance: ?Immunizations -- UTD ?Colonoscopy -- n/a ?Mammogram -- counseled ?PAP -- completed today; hx of colposcopy ?Bone Density -- n/a ?Diet -- limiting healthy ?Sleep habits -- no concerns overall ?Exercise -- running regularly; adding in some strength training ?Mood -- irritable at times but running helps with this ?Weight history: ?Wt Readings from Last 10 Encounters:  ?04/22/21 172 lb (78 kg)  ?04/22/20 160 lb 15 oz (73 kg)  ?03/05/20 161 lb 6.1 oz (73.2 kg)  ?10/04/18 157 lb (71.2 kg)  ?09/25/18 157 lb (71.2 kg)  ? ?Body mass index is 30.47 kg/m?Marland Kitchen ?No LMP recorded. ?Alcohol use:  reports current alcohol use of about 2.0 standard drinks per week. ?Tobacco use:  ?Tobacco Use: Low Risk   ? Smoking Tobacco Use: Never  ? Smokeless Tobacco Use: Never  ? Passive Exposure: Not on file  ? ?UTD with dentist and eye doctor ? ? ?  04/22/2021  ?  8:07 AM  ?Depression screen PHQ 2/9  ?Decreased Interest 0  ?Down, Depressed, Hopeless 0  ?PHQ - 2 Score 0  ? ? ? ?Other providers/specialists: ?Patient Care Team: ?Inda Coke, PA as PCP - General (Physician Assistant)  ? ? ?PMHx, SurgHx, SocialHx, Medications, and Allergies were reviewed in the Visit Navigator and updated as appropriate.  ? ?Past Medical History:  ?Diagnosis Date  ? Frequent headaches   ? History of chicken pox   ? ? ? ?Past Surgical History:  ?Procedure Laterality Date  ? BICEPT TENODESIS Left 04/22/2020  ? Procedure: SHOULDER ARTHROSCOPY WITH EXTENSIVE DEBRIDEMENT; SUBACROMIAL DECOMPRESSION; PARTIAL ACROMIOPLASTY WITH CORACOACROMIAL RELEASE AND BICEP TENODESIS;  Surgeon: Hiram Gash, MD;   Location: [place];  Service: Orthopedics;  Laterality: Left;  ? CESAREAN SECTION  2011, 2015  ? SHOULDER ARTHROSCOPY WITH ROTATOR CUFF REPAIR AND SUBACROMIAL DECOMPRESSION Left 04/22/2020  ? Procedure: SHOULDER ARTHROSCOPY WITH DISTAL CLAVICULECTOMY;  Surgeon: Hiram Gash, MD;  Location: [place];  Service: Orthopedics;  Laterality: Left;  ? ? ? ?Family History  ?Problem Relation Age of Onset  ? Uterine cancer Mother   ? Breast cancer Maternal Grandmother   ? Colon cancer Neg Hx   ? ? ?Social History  ? ?Tobacco Use  ? Smoking status: Never  ? Smokeless tobacco: Never  ?Vaping Use  ? Vaping Use: Never used  ?Substance Use Topics  ? Alcohol use: Yes  ?  Alcohol/week: 2.0 standard drinks  ?  Types: 2 Glasses of wine per week  ?  Comment: occ  ? Drug use: Never  ? ? ?Review of Systems:  ? ?Review of Systems  ?Constitutional:  Negative for chills, fever, malaise/fatigue and weight loss.  ?HENT:  Negative for hearing loss, sinus pain and sore throat.   ?Respiratory:  Negative for cough and hemoptysis.   ?Cardiovascular:  Negative for chest pain, palpitations, leg swelling and PND.  ?Gastrointestinal:  Negative for abdominal pain, constipation, diarrhea, heartburn, nausea and vomiting.  ?Genitourinary:  Negative for dysuria, frequency and urgency.  ?Musculoskeletal:  Negative for back pain, myalgias and neck pain.  ?Skin:  Negative  for itching and rash.  ?Neurological:  Negative for dizziness, tingling, seizures and headaches.  ?Endo/Heme/Allergies:  Negative for polydipsia.  ?Psychiatric/Behavioral:  Negative for depression. The patient is not nervous/anxious.   ? ?Objective:  ? ?BP 120/78 (BP Location: Left Arm, Patient Position: Sitting, Cuff Size: Normal)   Pulse 70   Temp 98.3 ?F (36.8 ?C) (Temporal)   Ht 5\' 3"  (1.6 m)   Wt 172 lb (78 kg)   SpO2 98%   BMI 30.47 kg/m?  ?Body mass index is 30.47 kg/m?. ? ? ?General Appearance:    Alert, cooperative, no distress, appears stated  age  ?Head:    Normocephalic, without obvious abnormality, atraumatic  ?Eyes:    PERRL, conjunctiva/corneas clear, EOM's intact, fundi  ?  benign, both eyes  ?Ears:    Normal TM's and external ear canals, both ears  ?Nose:   Nares normal, septum midline, mucosa normal, no drainage    or sinus tenderness  ?Throat:   Lips, mucosa, and tongue normal; teeth and gums normal  ?Neck:   Supple, symmetrical, trachea midline, no adenopathy;  ?  thyroid:  no enlargement/tenderness/nodules; no carotid ?  bruit or JVD  ?Back:     Symmetric, no curvature, ROM normal, no CVA tenderness  ?Lungs:     Clear to auscultation bilaterally, respirations unlabored  ?Chest Wall:    No tenderness or deformity  ? Heart:    Regular rate and rhythm, S1 and S2 normal, no murmur, rub or gallop  ?Breast Exam:    No tenderness, masses, or nipple abnormality  ?Abdomen:     Soft, non-tender, bowel sounds active all four quadrants,  ?  no masses, no organomegaly  ?Genitalia:    Normal female without lesion, discharge or tenderness  ?Extremities:   Extremities normal, atraumatic, no cyanosis or edema  ?Pulses:   2+ and symmetric all extremities  ?Skin:   Skin color, texture, turgor normal, no rashes or lesions  ?Lymph nodes:   Cervical, supraclavicular, and axillary nodes normal  ?Neurologic:   CNII-XII intact, normal strength, sensation and reflexes  ?  throughout  ? ? ?Assessment/Plan:  ? ?Routine physical examination ?Today patient counseled on age appropriate routine health concerns for screening and prevention, each reviewed and up to date or declined. Immunizations reviewed and up to date or declined. Labs ordered and reviewed. Risk factors for depression reviewed and negative. Hearing function and visual acuity are intact. ADLs screened and addressed as needed. Functional ability and level of safety reviewed and appropriate. Education, counseling and referrals performed based on assessed risks today. Patient provided with a copy of personalized  plan for preventive services. ? ?Pap smear for cervical cancer screening ?Updated today ? ?Hyperlipidemia, unspecified hyperlipidemia type ?Update lipid panel and make recommendations accordingly ? ?Obesity, unspecified classification, unspecified obesity type, unspecified whether serious comorbidity present ?Recommend working on adding in Editor, commissioning ? ?Encounter for screening for other viral diseases ?Update Hep C screening today ? ?Patient Counseling: ?[x]    Nutrition: Stressed importance of moderation in sodium/caffeine intake, saturated fat and cholesterol, caloric balance, sufficient intake of fresh fruits, vegetables, fiber, calcium, iron, and 1 mg of folate supplement per day (for females capable of pregnancy).  ?[x]    Stressed the importance of regular exercise.   ?[x]    Substance Abuse: Discussed cessation/primary prevention of tobacco, alcohol, or other drug use; driving or other dangerous activities under the influence; availability of treatment for abuse.   ?[x]    Injury prevention: Discussed safety belts, safety helmets,  smoke detector, smoking near bedding or upholstery.   ?[x]    Sexuality: Discussed sexually transmitted diseases, partner selection, use of condoms, avoidance of unintended pregnancy  and contraceptive alternatives.  ?[x]    Dental health: Discussed importance of regular tooth brushing, flossing, and dental visits.  ?[x]    Health maintenance and immunizations reviewed. Please refer to Health maintenance section.  ? ?Inda Coke, PA-C ?[place] ? ? ? ? ? ? ? ? ?

## 2021-04-22 NOTE — Patient Instructions (Signed)
It was great to see you! ? ?Please go to the lab for blood work.  ? ?Our office will call you with your results unless you have chosen to receive results via MyChart. ? ?If your blood work is normal we will follow-up each year for physicals and as scheduled for chronic medical problems. ? ?If anything is abnormal we will treat accordingly and get you in for a follow-up. ? ?Take care, ? ?[facility] ?  ? ? ?

## 2021-04-25 LAB — CYTOLOGY - PAP
Adequacy: ABSENT
Comment: NEGATIVE
Diagnosis: NEGATIVE
High risk HPV: NEGATIVE

## 2021-04-27 LAB — HEPATITIS C ANTIBODY
Hepatitis C Ab: NONREACTIVE
SIGNAL TO CUT-OFF: 0.13 (ref <1.00)

## 2021-07-02 IMAGING — MR MR SHOULDER*L* W/O CM
4 of 5 series · 15 of 40 positions shown · non-contrast
Comparison: None.

CLINICAL DATA: Chronic left shoulder pain.

EXAM:
MRI OF THE LEFT SHOULDER WITHOUT CONTRAST
TECHNIQUE: Multiplanar, multisequence MR imaging of the shoulder was performed.
No intravenous contrast was administered.

[Series 6: PD fat-sat · axial · left · 4.0mm · 0.22mm/px · z∈[-47,+45]mm · 6 of 20 slices shown (1 of 2)]
[im 1/20]
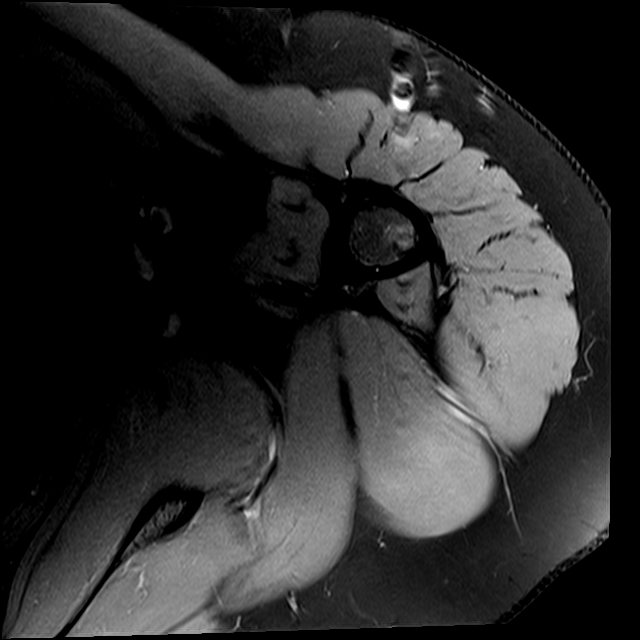
[im 4/20]
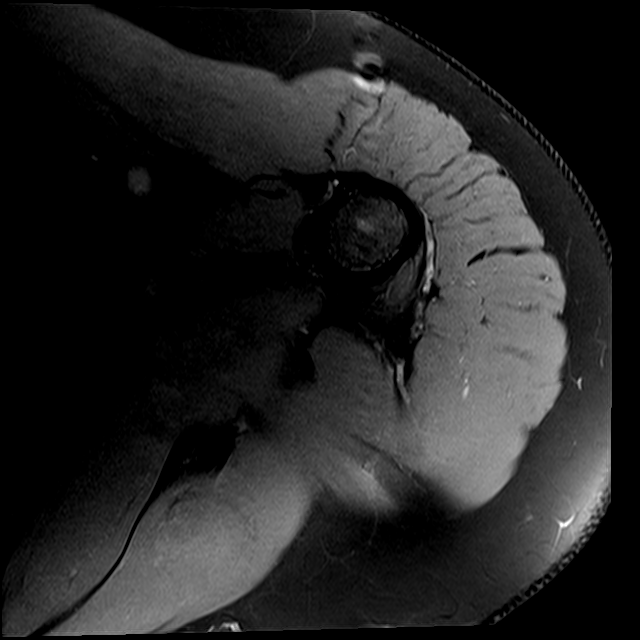
[im 8/20]
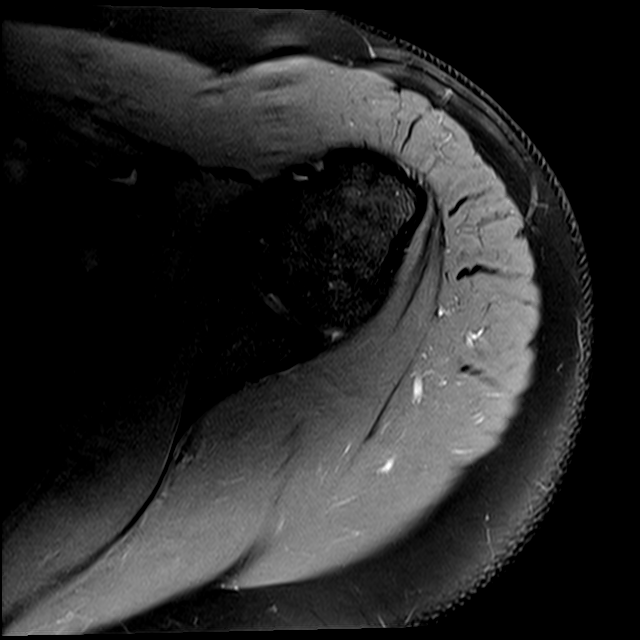
[im 12/20]
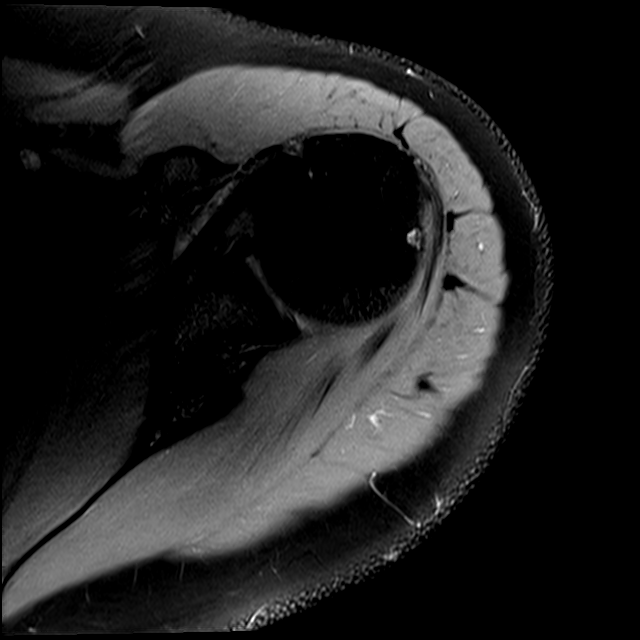
[im 16/20]
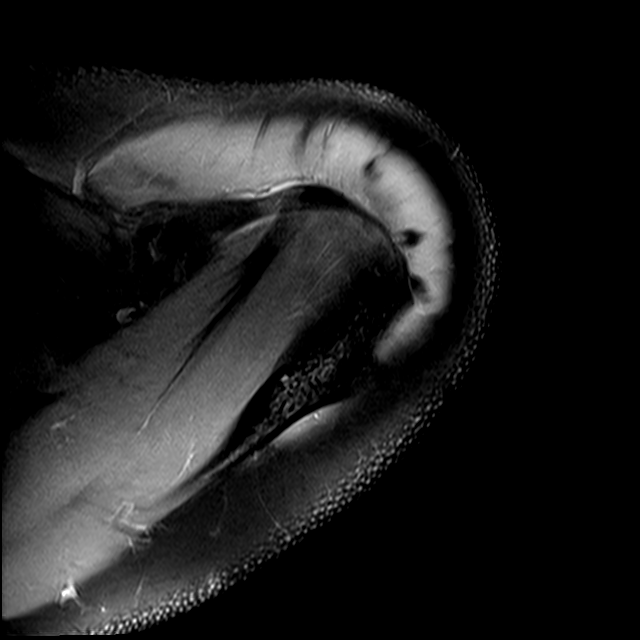
[im 20/20]
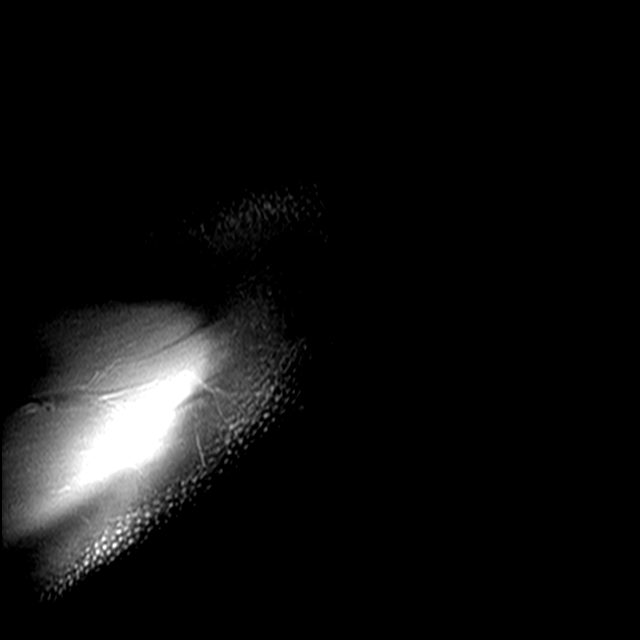

[Series 7: T2 fat-sat · oblique · left · 4.0mm · 0.22mm/px · 3 of 21 slices shown (1 of 2)]
[im 3/21]
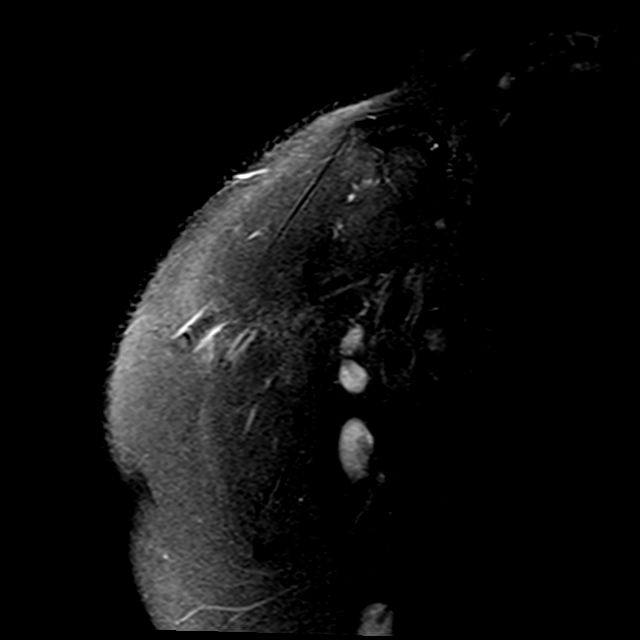
[im 12/21]
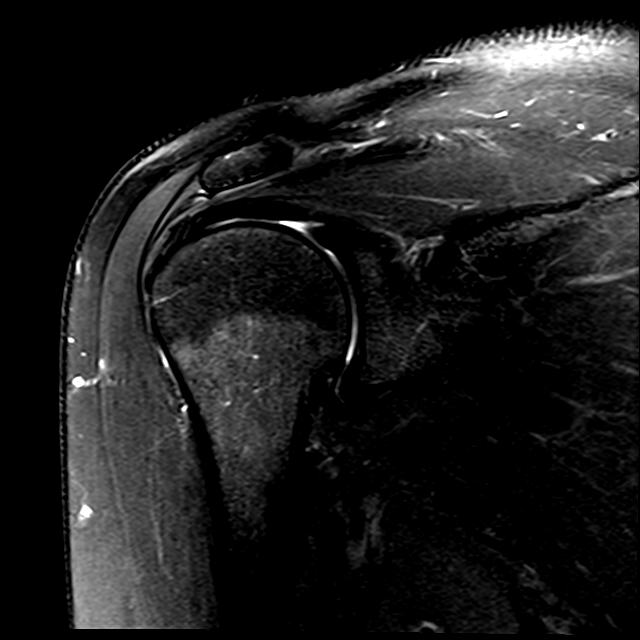
[im 18/21]
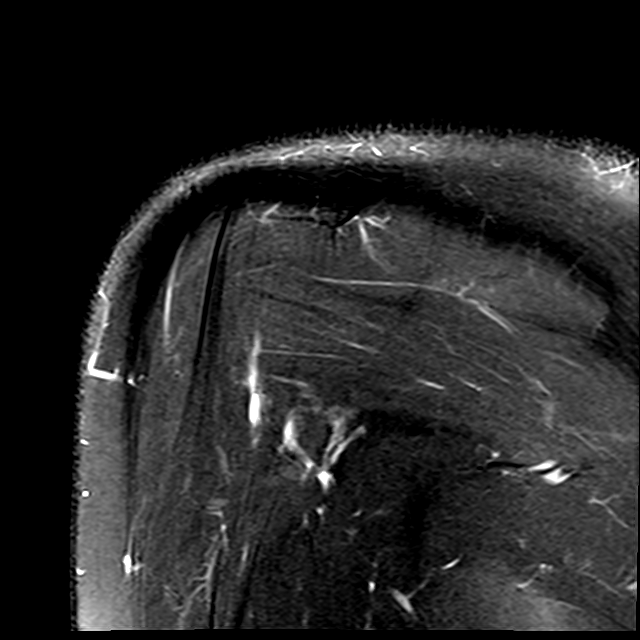

[Series 8: PD fat-sat · oblique · left · 4.0mm · 0.22mm/px · 3 of 21 slices shown (2 of 2)]
[im 3/21]
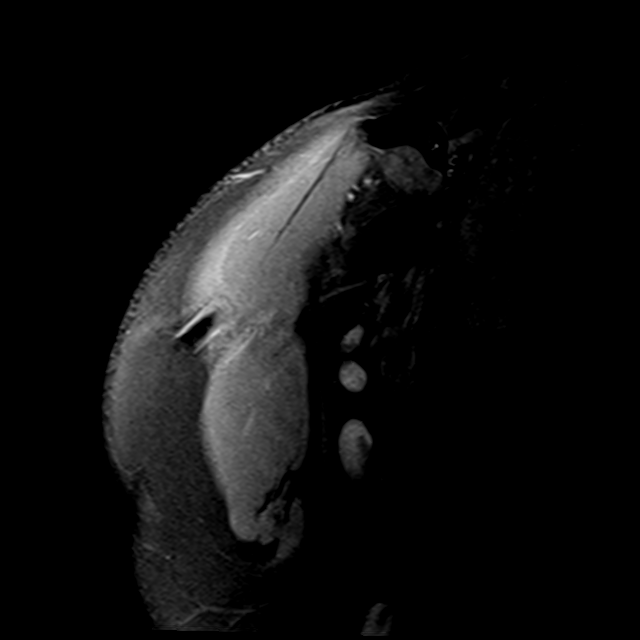
[im 12/21]
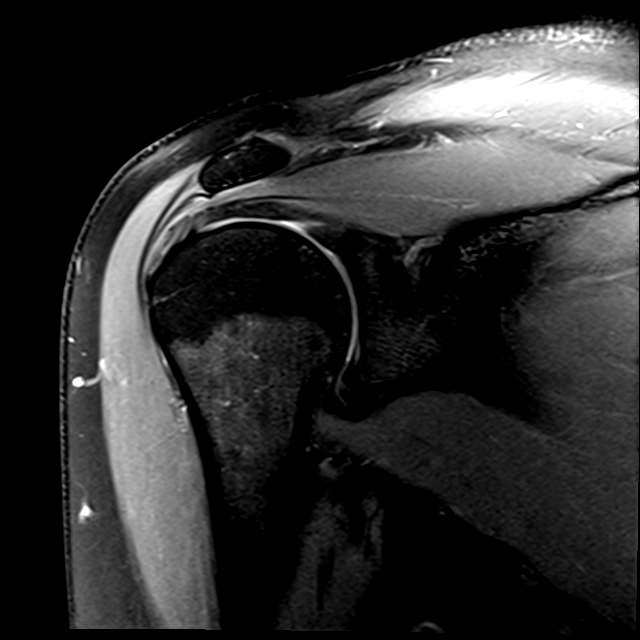
[im 18/21]
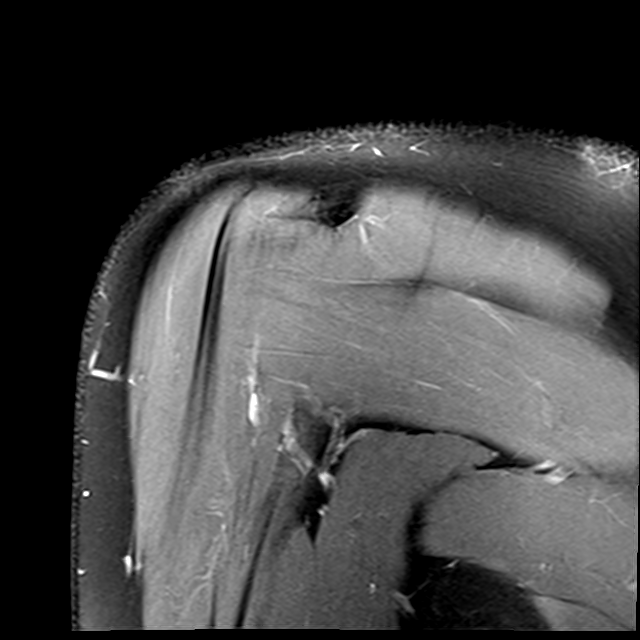

[Series 9: T2 fat-sat · oblique · left · 4.0mm · 0.44mm/px · 3 of 23 slices shown (2 of 2)]
[im 3/23]
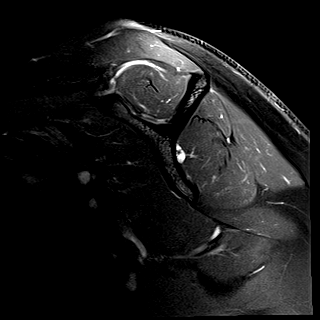
[im 12/23]
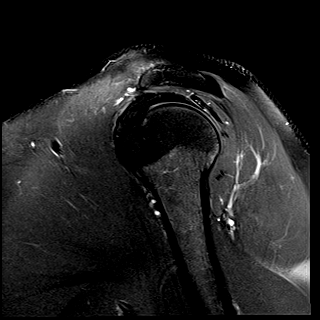
[im 20/23]
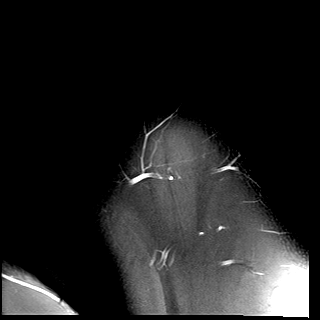

[15 of 40 positions shown; findings below may reference images not displayed]

FINDINGS: Rotator cuff:  Intact rotator cuff without significant tendinosis.

Muscles: No atrophy or abnormal signal of the muscles of the rotator
cuff.

Biceps long head: Intact and normally positioned. Moderate
intra-articular tendinosis.

Acromioclavicular Joint: Normal acromioclavicular joint. Type II
acromion. No subacromial/subdeltoid bursal fluid.

Glenohumeral Joint: No joint effusion. No chondral defect.

Labrum: Grossly intact, but evaluation is limited by lack of
intraarticular fluid.

Bones: Reactive subcortical cystic change in the posterior greater
tuberosity. No acute fracture or dislocation. No suspicious bone
lesion.

Other: None.
IMPRESSION: 1. Intact rotator cuff.
2. Moderate intra-articular biceps tendinosis.

## 2021-10-04 ENCOUNTER — Encounter: Payer: Self-pay | Admitting: Family Medicine

## 2021-10-04 ENCOUNTER — Ambulatory Visit (INDEPENDENT_AMBULATORY_CARE_PROVIDER_SITE_OTHER): Payer: 59 | Admitting: Family Medicine

## 2021-10-04 VITALS — BP 132/81 | Ht 63.0 in | Wt 172.0 lb

## 2021-10-04 DIAGNOSIS — M6289 Other specified disorders of muscle: Secondary | ICD-10-CM | POA: Diagnosis not present

## 2021-10-04 DIAGNOSIS — S83249A Other tear of medial meniscus, current injury, unspecified knee, initial encounter: Secondary | ICD-10-CM | POA: Insufficient documentation

## 2021-10-04 DIAGNOSIS — M67959 Unspecified disorder of synovium and tendon, unspecified thigh: Secondary | ICD-10-CM | POA: Diagnosis not present

## 2021-10-04 NOTE — Patient Instructions (Signed)
Nice to meet you Please try the exercises  Please consider the compression  You can try heat on the area   Please send me a message in [place] with any questions or updates.  Please see me back as needed.   --Dr. Raeford Razor

## 2021-10-04 NOTE — Assessment & Plan Note (Signed)
Acutely occurring.  Intermittent in nature.  Has good range of motion in the hip.  May have component of irritation of the labrum with the weakness with hip abduction. -Counseled on home exercise therapy and supportive care. -Could consider imaging

## 2021-10-04 NOTE — Assessment & Plan Note (Signed)
Acutely occurring.  The pain she experiences is more over the biceps femoris.  Has an imbalance with weakness in hip abduction that is likely leading to the pain. -Counseled on home exercise therapy and supportive care. -Counseled on compression -Could consider shockwave therapy, physical therapy

## 2021-10-04 NOTE — Progress Notes (Signed)
  Abigail Silva - 41 y.o. female MRN [id number]  Date of birth: 1980/08/24  SUBJECTIVE:  Including CC & ROS.  No chief complaint on file.   Abigail Silva is a 41 y.o. female that is presenting with acute left hamstring pain and right quad pain.  She is training for a marathon that is occurring in 1 month.  No specific injury.  She notices the left hamstring pain during a run.  This does get better through the round.  No history of similar pain.  She takes naproxen on a regular basis.   Review of Systems See HPI   HISTORY: Past Medical, Surgical, Social, and Family History Reviewed & Updated per EMR.   Pertinent Historical Findings include:  Past Medical History:  Diagnosis Date   Frequent headaches    History of chicken pox     Past Surgical History:  Procedure Laterality Date   BICEPT TENODESIS Left 04/22/2020   Procedure: SHOULDER ARTHROSCOPY WITH EXTENSIVE DEBRIDEMENT; SUBACROMIAL DECOMPRESSION; PARTIAL ACROMIOPLASTY WITH CORACOACROMIAL RELEASE AND BICEP TENODESIS;  Surgeon: Hiram Gash, MD;  Location: [place];  Service: Orthopedics;  Laterality: Left;   CESAREAN SECTION  2011, 2015   SHOULDER ARTHROSCOPY WITH ROTATOR CUFF REPAIR AND SUBACROMIAL DECOMPRESSION Left 04/22/2020   Procedure: SHOULDER ARTHROSCOPY WITH DISTAL CLAVICULECTOMY;  Surgeon: Hiram Gash, MD;  Location: [place];  Service: Orthopedics;  Laterality: Left;     PHYSICAL EXAM:  VS: BP 132/81 (BP Location: Left Arm, Patient Position: Sitting)   Ht 5\' 3"  (1.6 m)   Wt 172 lb (78 kg)   BMI 30.47 kg/m  Physical Exam Gen: NAD, alert, cooperative with exam, well-appearing MSK:  Neurovascularly intact       ASSESSMENT & PLAN:   Hamstring tightness of left lower extremity Acutely occurring.  The pain she experiences is more over the biceps femoris.  Has an imbalance with weakness in hip abduction that is likely leading to the pain. -Counseled on home exercise therapy  and supportive care. -Counseled on compression -Could consider shockwave therapy, physical therapy  Tendinopathy of gluteus medius Acutely occurring.  Intermittent in nature.  Has good range of motion in the hip.  May have component of irritation of the labrum with the weakness with hip abduction. -Counseled on home exercise therapy and supportive care. -Could consider imaging

## 2021-10-19 ENCOUNTER — Encounter: Payer: Self-pay | Admitting: Family Medicine

## 2021-10-20 ENCOUNTER — Other Ambulatory Visit (HOSPITAL_COMMUNITY): Payer: Self-pay

## 2021-10-20 ENCOUNTER — Other Ambulatory Visit: Payer: Self-pay | Admitting: Family Medicine

## 2021-10-20 MED ORDER — PREDNISONE 5 MG PO TABS
ORAL_TABLET | ORAL | 0 refills | Status: AC
  Filled 2021-10-20: qty 21, 6d supply, fill #0

## 2021-11-18 DIAGNOSIS — M79669 Pain in unspecified lower leg: Secondary | ICD-10-CM | POA: Diagnosis not present

## 2021-11-30 ENCOUNTER — Ambulatory Visit: Payer: 59 | Admitting: Family Medicine

## 2021-11-30 ENCOUNTER — Encounter: Payer: Self-pay | Admitting: Family Medicine

## 2021-11-30 ENCOUNTER — Ambulatory Visit (HOSPITAL_BASED_OUTPATIENT_CLINIC_OR_DEPARTMENT_OTHER)
Admission: RE | Admit: 2021-11-30 | Discharge: 2021-11-30 | Disposition: A | Discharge status: Home / Self Care | Payer: 59 | Source: Ambulatory Visit | Attending: Family Medicine | Admitting: Family Medicine

## 2021-11-30 VITALS — BP 120/70 | Ht 63.0 in | Wt 173.0 lb

## 2021-11-30 DIAGNOSIS — M25562 Pain in left knee: Secondary | ICD-10-CM | POA: Diagnosis not present

## 2021-11-30 DIAGNOSIS — M79669 Pain in unspecified lower leg: Secondary | ICD-10-CM | POA: Diagnosis not present

## 2021-11-30 DIAGNOSIS — S83242D Other tear of medial meniscus, current injury, left knee, subsequent encounter: Secondary | ICD-10-CM

## 2021-11-30 NOTE — Patient Instructions (Signed)
Good to see you We will call with the xray results.  We'll get the MRI at [facility] imaging   Please send me a message in MyChart with any questions or updates.  We'll setup a virtual visit once the MRI is resulted.   --Dr. Jordan Likes

## 2021-11-30 NOTE — Progress Notes (Signed)
  Abigail Silva - 41 y.o. female MRN [id number]  Date of birth: 07/31/1980  SUBJECTIVE:  Including CC & ROS.  No chief complaint on file.   Abigail Silva is a 41 y.o. female that is following up for her left knee pain.  Continues to have the pain with any form with squatting.  Unable to run.    Review of Systems See HPI   HISTORY: Past Medical, Surgical, Social, and Family History Reviewed & Updated per EMR.   Pertinent Historical Findings include:  Past Medical History:  Diagnosis Date   Frequent headaches    History of chicken pox     Past Surgical History:  Procedure Laterality Date   BICEPT TENODESIS Left 04/22/2020   Procedure: SHOULDER ARTHROSCOPY WITH EXTENSIVE DEBRIDEMENT; SUBACROMIAL DECOMPRESSION; PARTIAL ACROMIOPLASTY WITH CORACOACROMIAL RELEASE AND BICEP TENODESIS;  Surgeon: Bjorn Pippin, MD;  Location: [facility];  Service: Orthopedics;  Laterality: Left;   CESAREAN SECTION  2011, 2015   SHOULDER ARTHROSCOPY WITH ROTATOR CUFF REPAIR AND SUBACROMIAL DECOMPRESSION Left 04/22/2020   Procedure: SHOULDER ARTHROSCOPY WITH DISTAL CLAVICULECTOMY;  Surgeon: Bjorn Pippin, MD;  Location: [facility];  Service: Orthopedics;  Laterality: Left;     PHYSICAL EXAM:  VS: BP 120/70   Ht 5\' 3"  (1.6 m)   Wt 173 lb (78.5 kg)   BMI 30.65 kg/m  Physical Exam Gen: NAD, alert, cooperative with exam, well-appearing MSK:  Left knee: Effusion noted. Positive McMurray's test. Limited flexion and extension Neurovascularly intact       ASSESSMENT & PLAN:   Acute medial meniscal tear Acutely still experiencing pain.  Seems most consistent with a posterior aspect meniscal tear.  No improvement with modalities today.  Has tried greater than 6 weeks of physician directed home exercise therapy. -Counseled on home exercise therapy and supportive care. -X-ray. -MRI of the left knee to evaluate for meniscal tear for presurgical planning.

## 2021-11-30 NOTE — Assessment & Plan Note (Signed)
Acutely still experiencing pain.  Seems most consistent with a posterior aspect meniscal tear.  No improvement with modalities today.  Has tried greater than 6 weeks of physician directed home exercise therapy. -Counseled on home exercise therapy and supportive care. -X-ray. -MRI of the left knee to evaluate for meniscal tear for presurgical planning.

## 2021-12-09 ENCOUNTER — Ambulatory Visit
Admission: RE | Admit: 2021-12-09 | Discharge: 2021-12-09 | Disposition: A | Discharge status: Home / Self Care | Payer: 59 | Source: Ambulatory Visit | Attending: Family Medicine | Admitting: Family Medicine

## 2021-12-09 DIAGNOSIS — S8992XA Unspecified injury of left lower leg, initial encounter: Secondary | ICD-10-CM | POA: Diagnosis not present

## 2021-12-09 DIAGNOSIS — S83242D Other tear of medial meniscus, current injury, left knee, subsequent encounter: Secondary | ICD-10-CM

## 2021-12-09 DIAGNOSIS — M25562 Pain in left knee: Secondary | ICD-10-CM | POA: Diagnosis not present

## 2021-12-14 ENCOUNTER — Telehealth (INDEPENDENT_AMBULATORY_CARE_PROVIDER_SITE_OTHER): Payer: 59 | Admitting: Family Medicine

## 2021-12-14 ENCOUNTER — Encounter: Payer: Self-pay | Admitting: Family Medicine

## 2021-12-14 VITALS — Ht 63.0 in | Wt 173.0 lb

## 2021-12-14 DIAGNOSIS — S83242D Other tear of medial meniscus, current injury, left knee, subsequent encounter: Secondary | ICD-10-CM

## 2021-12-14 DIAGNOSIS — M79669 Pain in unspecified lower leg: Secondary | ICD-10-CM | POA: Diagnosis not present

## 2021-12-14 NOTE — Assessment & Plan Note (Signed)
MRI was demonstrating what appears to be a horizontal tear medial meniscus of the posterior aspect of the knee.  No displacement -Counseled on home exercise therapy and supportive care. -Pursue injection. -Could consider physical therapy or nerve ablation.

## 2021-12-14 NOTE — Progress Notes (Signed)
Virtual Visit via Video Note  I connected with Abigail Silva on 12/14/21 at  3:10 PM EST by a video enabled telemedicine application and verified that I am speaking with the correct person using two identifiers.  Location: Patient: home  Provider: office   I discussed the limitations of evaluation and management by telemedicine and the availability of in person appointments. The patient expressed understanding and agreed to proceed.  History of Present Illness:  Abigail Silva is a 41 year old female that is following up after the MRI of her left knee.  This was demonstrating mild degenerative changes within the joint itself.  Observations/Objective:   Assessment and Plan:  Left knee meniscal tear: MRI was demonstrating what appears to be a horizontal tear medial meniscus of the posterior aspect of the knee.  No displacement -Counseled on home exercise therapy and supportive care. -Pursue injection. -Could consider physical therapy or nerve ablation.  Follow Up Instructions:    I discussed the assessment and treatment plan with the patient. The patient was provided an opportunity to ask questions and all were answered. The patient agreed with the plan and demonstrated an understanding of the instructions.   The patient was advised to call back or seek an in-person evaluation if the symptoms worsen or if the condition fails to improve as anticipated.    Clare Gandy, MD

## 2021-12-22 ENCOUNTER — Ambulatory Visit (INDEPENDENT_AMBULATORY_CARE_PROVIDER_SITE_OTHER): Payer: 59 | Admitting: Family Medicine

## 2021-12-22 ENCOUNTER — Encounter: Payer: Self-pay | Admitting: Family Medicine

## 2021-12-22 ENCOUNTER — Ambulatory Visit: Payer: Self-pay

## 2021-12-22 VITALS — BP 118/76 | Ht 63.0 in | Wt 173.0 lb

## 2021-12-22 DIAGNOSIS — S83242D Other tear of medial meniscus, current injury, left knee, subsequent encounter: Secondary | ICD-10-CM

## 2021-12-22 MED ORDER — TRIAMCINOLONE ACETONIDE 40 MG/ML IJ SUSP
40.0000 mg | Freq: Once | INTRAMUSCULAR | Status: AC
  Administered 2021-12-22: 40 mg via INTRA_ARTICULAR

## 2021-12-22 NOTE — Progress Notes (Signed)
  Abigail Silva - 41 y.o. female MRN [id number]  Date of birth: [birth year]  SUBJECTIVE:  Including CC & ROS.  No chief complaint on file.   Abigail Silva is a 41 y.o. female that is here for a knee injection.    Review of Systems See HPI   HISTORY: Past Medical, Surgical, Social, and Family History Reviewed & Updated per EMR.   Pertinent Historical Findings include:  Past Medical History:  Diagnosis Date   Frequent headaches    History of chicken pox     Past Surgical History:  Procedure Laterality Date   BICEPT TENODESIS Left 04/22/2020   Procedure: SHOULDER ARTHROSCOPY WITH EXTENSIVE DEBRIDEMENT; SUBACROMIAL DECOMPRESSION; PARTIAL ACROMIOPLASTY WITH CORACOACROMIAL RELEASE AND BICEP TENODESIS;  Surgeon: Bjorn Pippin, MD;  Location: [facility];  Service: Orthopedics;  Laterality: Left;   CESAREAN SECTION  2011, 2015   SHOULDER ARTHROSCOPY WITH ROTATOR CUFF REPAIR AND SUBACROMIAL DECOMPRESSION Left 04/22/2020   Procedure: SHOULDER ARTHROSCOPY WITH DISTAL CLAVICULECTOMY;  Surgeon: Bjorn Pippin, MD;  Location: [facility];  Service: Orthopedics;  Laterality: Left;     PHYSICAL EXAM:  VS: BP 118/76 (BP Location: Left Arm, Patient Position: Sitting)   Ht 5\' 3"  (1.6 m)   Wt 173 lb (78.5 kg)   BMI 30.65 kg/m  Physical Exam Gen: NAD, alert, cooperative with exam, well-appearing MSK:  Neurovascularly intact     Aspiration/Injection Procedure Note Abigail Silva July 26, 1980  Procedure: Injection Indications: left knee pain  Procedure Details Consent: Risks of procedure as well as the alternatives and risks of each were explained to the (patient/caregiver).  Consent for procedure obtained. Time Out: Verified patient identification, verified procedure, site/side was marked, verified correct patient position, special equipment/implants available, medications/allergies/relevent history reviewed, required imaging and test results available.   Performed.  The area was cleaned with iodine and alcohol swabs.    The left knee superior lateral suprapatellar pouch was injected using 1 cc of 1% lidocaine on a 22-gauge 1-1/2 inch needle.  The syringe was switched to mixture containing 1 cc's of 40 mg Kenalog and 4 cc's of 0.25% bupivacaine was injected.  Ultrasound was used. Images were obtained in long views showing the injection.     A sterile dressing was applied.  Patient did tolerate procedure well.     ASSESSMENT & PLAN:   Acute medial meniscal tear MRI showing changes of the meniscus.  - injection today - could consider hip or back imaging.

## 2021-12-22 NOTE — Assessment & Plan Note (Signed)
MRI showing changes of the meniscus.  - injection today - could consider hip or back imaging.

## 2021-12-22 NOTE — Patient Instructions (Signed)
Good to see you Please try the exercises  Please use ice as needed  Please keep me updated on the knee  Please send me a message in MyChart with any questions or updates.  Please see me back in 6-8 weeks.   --Dr. Jordan Likes

## 2022-01-25 DIAGNOSIS — M79669 Pain in unspecified lower leg: Secondary | ICD-10-CM | POA: Diagnosis not present

## 2022-02-22 DIAGNOSIS — M79669 Pain in unspecified lower leg: Secondary | ICD-10-CM | POA: Diagnosis not present

## 2022-04-17 ENCOUNTER — Encounter: Payer: Self-pay | Admitting: *Deleted

## 2022-04-26 DIAGNOSIS — M79669 Pain in unspecified lower leg: Secondary | ICD-10-CM | POA: Diagnosis not present
